# Patient Record
Sex: Female | Born: 1940 | Race: White | Hispanic: No | Smoking: Never smoker
Health system: Southern US, Community
[De-identification: ages and names within clinical notes are randomized; demographics above are authoritative.]

## PROBLEM LIST (undated history)

## (undated) DIAGNOSIS — R42 Dizziness and giddiness: Secondary | ICD-10-CM

## (undated) DIAGNOSIS — E7800 Pure hypercholesterolemia, unspecified: Secondary | ICD-10-CM

## (undated) DIAGNOSIS — I4891 Unspecified atrial fibrillation: Secondary | ICD-10-CM

## (undated) DIAGNOSIS — E079 Disorder of thyroid, unspecified: Secondary | ICD-10-CM

## (undated) DIAGNOSIS — I10 Essential (primary) hypertension: Secondary | ICD-10-CM

## (undated) DIAGNOSIS — J189 Pneumonia, unspecified organism: Secondary | ICD-10-CM

---

## 1998-06-24 ENCOUNTER — Ambulatory Visit (HOSPITAL_COMMUNITY): Admission: RE | Admit: 1998-06-24 | Discharge: 1998-06-24 | Payer: Self-pay | Admitting: Family Medicine

## 2000-06-28 ENCOUNTER — Ambulatory Visit (HOSPITAL_COMMUNITY): Admission: RE | Admit: 2000-06-28 | Discharge: 2000-06-28 | Payer: Self-pay | Admitting: Family Medicine

## 2000-06-28 ENCOUNTER — Encounter: Payer: Self-pay | Admitting: Family Medicine

## 2002-01-23 ENCOUNTER — Ambulatory Visit (HOSPITAL_COMMUNITY): Admission: RE | Admit: 2002-01-23 | Discharge: 2002-01-23 | Payer: Self-pay | Admitting: Family Medicine

## 2002-05-09 ENCOUNTER — Ambulatory Visit (HOSPITAL_COMMUNITY): Admission: RE | Admit: 2002-05-09 | Discharge: 2002-05-09 | Payer: Self-pay | Admitting: Family Medicine

## 2002-05-09 ENCOUNTER — Encounter: Payer: Self-pay | Admitting: Family Medicine

## 2003-01-31 ENCOUNTER — Ambulatory Visit (HOSPITAL_COMMUNITY): Admission: RE | Admit: 2003-01-31 | Discharge: 2003-01-31 | Payer: Self-pay | Admitting: Family Medicine

## 2003-02-15 ENCOUNTER — Ambulatory Visit (HOSPITAL_COMMUNITY): Admission: RE | Admit: 2003-02-15 | Discharge: 2003-02-15 | Payer: Self-pay | Admitting: Family Medicine

## 2003-02-15 ENCOUNTER — Encounter: Payer: Self-pay | Admitting: Family Medicine

## 2005-04-03 ENCOUNTER — Emergency Department (HOSPITAL_COMMUNITY): Admission: EM | Admit: 2005-04-03 | Discharge: 2005-04-03 | Payer: Self-pay | Admitting: Emergency Medicine

## 2005-06-17 ENCOUNTER — Emergency Department (HOSPITAL_COMMUNITY): Admission: EM | Admit: 2005-06-17 | Discharge: 2005-06-17 | Payer: Self-pay | Admitting: Emergency Medicine

## 2008-06-03 ENCOUNTER — Inpatient Hospital Stay (HOSPITAL_COMMUNITY): Admission: EM | Admit: 2008-06-03 | Discharge: 2008-06-04 | Payer: Self-pay | Admitting: Emergency Medicine

## 2008-06-04 ENCOUNTER — Ambulatory Visit: Payer: Self-pay | Admitting: Surgery

## 2008-06-04 ENCOUNTER — Encounter (INDEPENDENT_AMBULATORY_CARE_PROVIDER_SITE_OTHER): Payer: Self-pay | Admitting: Internal Medicine

## 2011-02-23 NOTE — Discharge Summary (Signed)
[name], Sonya [name]              ACCOUNT NO.:  [id number]   MEDICAL RECORD NO.:  [id number]          PATIENT TYPE:  INP   LOCATION:  1412                         FACILITY:  [facility]   PHYSICIAN:  Hind I Elsaid, MD      DATE OF BIRTH:  28-Mar-1941   DATE OF ADMISSION:  06/03/2008  DATE OF DISCHARGE:  06/04/2008                               DISCHARGE SUMMARY   DISCHARGE DIAGNOSES:  1. Vertigo.  2. Gastroesophageal reflux disease.  3. Hypertension.  4. Hypertriglyceridemia.  5. Bilateral carotid stenosis 40-60%.  6. Anemia.   DISCHARGE MEDICATIONS:  1. Meclizine 25 mg p.o. q.6 hours.  2. Ativan 0.5 mg p.o. b.i.d. p.r.n.  3. Tricor 145 mg p.o. daily.   The patient to continue her home medications, which include:  1. Ranitidine.  2. Hydrochlorothiazide.  3. Lisinopril.   CONSULTATIONS:  None.   PROCEDURES:  CT head, chronic vessel disease.  MRI of the brain, no  acute infarct or abnormal intracranial enhancing lesions.  Scattered  nonspecific white-type changes.  Paranasal sinuses and left mastoid  airspace.  Mild intracranial atherosclerotic-type changes.  Moderate  stenosis of proximal left internal carotid artery and mild degree of  stenosis proximal right internal carotid artery.  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   HISTORY OF PRESENT ILLNESS:  1. This is a 70 year old female with a history of vertigo, who      presented with dizziness and vertigo.  The ER physician gave      Ativan, Zofran, and Meclizine, but no significant improvement.  We      are asked to admit to evaluate further vertigo.  The patient      admitted to the hospital and started on supportive measures with      Meclizine and Ativan.  Symptoms completely resolved within 24      hours.  Had MRI of the brain to rule out acute stroke or brainstem      infarct.  This was as above, negative.  Was felt symptoms most      probably secondary to vertigo.  The patient will be discharged with  Meclizine and Ativan, and the patient was asked to follow up with      [facility].  Also, physical therapy consulted for teaching the      patient Epley maneuver.  2. Hypertriglyceridemia, the patient will be discharged with Tri-      Chlor.  3. Hypertension.  Blood pressure came under control.  4. Depression.  The patient denies any suicidal ideations and denies      any symptoms, and at this time, she did not want any antidepressant      medications.  We felt that the patient was medically stable for      discharge home, follow up with her primary care physician within 1      week.      Hind Bosie Helper, MD  Electronically Signed     HIE/MEDQ  D:  06/04/2008  T:  06/04/2008  Job:  [id number]

## 2011-02-23 NOTE — H&P (Signed)
NAME:  Sonya [name], Sonya [name]              ACCOUNT NO.:  [id number]   MEDICAL RECORD NO.:  [id number]          PATIENT TYPE:  EMS   LOCATION:  ED                           FACILITY:  [facility]   PHYSICIAN:  Hind I Elsaid, MD      DATE OF BIRTH:  Oct 15, 1940   DATE OF ADMISSION:  06/03/2008  DATE OF DISCHARGE:                              HISTORY & PHYSICAL   PRIMARY CARE PHYSICIAN:  Unassigned.   CHIEF COMPLAINT:  Dizziness, one day.   HISTORY OF PRESENT ILLNESS:  This is a 70 year old Caucasian female with  a history of vertigo.  The last attack was one week ago.  The patient  woke up today with a chief complaint of dizziness and spinning.  The  patient felt she is spinning around the room, associated with nausea and  vomiting.  The patient vomited four times, clear liquids to food  particles.  The condition associated with sweating, feeling hot.  The  patient admitted the symptoms reverted when she lies flat.  She has to  turn her head gradually so to avoid worsening.  The patient denies any  numbness or weakness.  Denies any neck pain.  Denies any ear discharge.  Denies any roaring on her ear.  Denies any blurring of vision.   In the emergency room, the patient received Zofran, meclizine 50 mg and  Ativan 1 mg IV with no significant improvement of her symptoms.   PAST MEDICAL HISTORY:  1. Vertigo.  2. GERD.  3. Hypertension.   MEDICATIONS:  1. Loratadine.  2. Lisinopril.  3. Hydrochlorothiazide, dose unknown.   ALLERGIES:  No known drug allergies.   SOCIAL HISTORY:  She drinks.  She lives by herself.  She has five  children and grandchildren.  She occasionally smokes cigarettes and  drinks alcohol.  She admitted she drinks 12 beers twice weekly.  She  denies any heavy drinking.  She denies IV drug use.  She is on social  disability.   FAMILY HISTORY:  Mother died at age 87.  She had a history of bypass  surgery.  Father history of a stroke.   REVIEW OF SYSTEMS:  Patient denies  chest pain.  Denies any shortness of  breath.  Denies any abdominal pain.  Denies any burning micturition.  Denies any headache.  Denies any seizure activity.  Denies any abnormal  skin rash.   PHYSICAL EXAMINATION:  Temperature 97.7, blood pressure 144/52, pulse  rate 128, sinus tach.  Respiratory rate 24.  Satting at 98% on room air.  HEENT:  Patient looks sick.  Not in respiratory distress or shortness of  breath.  Pupils are equal, round and reactive to light and  accommodation.  No nystagmus on examination.  Mucosa moist.  NECK:  Supple.  No JVD.  No lymphadenopathy.  HEART:  S1 and S2.  No added sounds.  LUNGS:  Normal fascicular breathing with equal air entry.  ABDOMEN:  Soft, nontender.  Bowel sounds positive.  EXTREMITIES:  No lower extremity edema.  Peripheral pulses are intact.  CNS:  Patient awake, alert, and oriented x3.  Could not appreciate any  focal neurological deficits.  Gait is not done secondary to patient's  symptoms.   BLOOD WORK:  Urinalysis:  White blood cells 3-6.  Cardiac markers  negative.  CBC:  Bilirubin 11.3, hematocrit 34.8, white blood cells 8.6,  platelets 267.  Sodium 139, potassium 4, chloride 108, glucose 127, BUN  18, creatinine 0.9.   CT head cannot be done, as the patient has severe symptoms.   ASSESSMENT/PLAN:  1. Vertigo:  Cannot rule out benign positional vertigo.  Cannot rule      out brain or cerebral stroke or Meniere's disease.  Patient was      admitted to the hospital to get supportive measures with meclizine      and Ativan.  Will get MRI of the brain and MRA of brain and neck.  2. Hypertension:  Will continue with lisinopril and      hydrochlorothiazide.  3. Family concerned that grandmother many times mentioned that she      does want to live anymore.  They feel she is depressed after her      husband died and recently she lost her son.  Will start the patient      on Lexapro 10 mg daily and will consider consulting Dr.  Jeanie Sewer.      At this time, patient denies any suicidal ideation.  4. Deep venous thrombosis and gastrointestinal prophylaxis.      Hind Bosie Helper, MD  Electronically Signed     HIE/MEDQ  D:  06/03/2008  T:  06/03/2008  Job:  [id number]

## 2014-07-11 ENCOUNTER — Encounter (HOSPITAL_COMMUNITY): Payer: Self-pay | Admitting: Emergency Medicine

## 2014-07-11 ENCOUNTER — Emergency Department (HOSPITAL_COMMUNITY)
Admission: EM | Admit: 2014-07-11 | Discharge: 2014-07-12 | Disposition: A | Discharge status: Home / Self Care | Payer: Medicare Other | Attending: Emergency Medicine | Admitting: Emergency Medicine

## 2014-07-11 DIAGNOSIS — R002 Palpitations: Secondary | ICD-10-CM | POA: Insufficient documentation

## 2014-07-11 DIAGNOSIS — Z87891 Personal history of nicotine dependence: Secondary | ICD-10-CM | POA: Insufficient documentation

## 2014-07-11 DIAGNOSIS — I10 Essential (primary) hypertension: Secondary | ICD-10-CM | POA: Insufficient documentation

## 2014-07-11 DIAGNOSIS — R0602 Shortness of breath: Secondary | ICD-10-CM | POA: Insufficient documentation

## 2014-07-11 DIAGNOSIS — K219 Gastro-esophageal reflux disease without esophagitis: Secondary | ICD-10-CM | POA: Insufficient documentation

## 2014-07-11 DIAGNOSIS — Z8701 Personal history of pneumonia (recurrent): Secondary | ICD-10-CM | POA: Insufficient documentation

## 2014-07-11 DIAGNOSIS — E079 Disorder of thyroid, unspecified: Secondary | ICD-10-CM | POA: Insufficient documentation

## 2014-07-11 DIAGNOSIS — Z7982 Long term (current) use of aspirin: Secondary | ICD-10-CM | POA: Insufficient documentation

## 2014-07-11 DIAGNOSIS — E782 Mixed hyperlipidemia: Secondary | ICD-10-CM | POA: Insufficient documentation

## 2014-07-11 DIAGNOSIS — Z79899 Other long term (current) drug therapy: Secondary | ICD-10-CM | POA: Insufficient documentation

## 2014-07-11 DIAGNOSIS — R0789 Other chest pain: Secondary | ICD-10-CM

## 2014-07-11 HISTORY — DX: Pneumonia, unspecified organism: J18.9

## 2014-07-11 HISTORY — DX: Dizziness and giddiness: R42

## 2014-07-11 HISTORY — DX: Pure hypercholesterolemia, unspecified: E78.00

## 2014-07-11 HISTORY — DX: Essential (primary) hypertension: I10

## 2014-07-11 HISTORY — DX: Disorder of thyroid, unspecified: E07.9

## 2014-07-11 NOTE — ED Provider Notes (Signed)
CSN: [id number]     Arrival date & time 07/11/14  2336 History   This chart was scribed for Sonya Kaplan, MD by Sonya [name], ED Scribe. This patient was seen in room APA08/APA08 and the patient's care was started at 11:43 PM.    Chief Complaint  Patient presents with  . Chest Pain    The history is provided by the patient. No language interpreter was used.    HPI Comments: Sonya [name] is a 73 y.o. female, with history of HTN, thyroid disease, vertigo, and pneumonia, who presents to the Emergency Department complaining of worsening palpitations onset 2 weeks ago. She reports it started out as a "flutter" of her heart, but has gotten worse. There is associated mild CP - described as a feeling one gets when they are lifting and SOB, but states this has improved over the last few days. Pt has no exertional dyspnea or chest pain, and her sx are intermittent, and sporadic. Patient states  she feels the chest pain across her chest, on both sides and sometime into her back. Patient states her symptoms worsen when lying flat - as the flutter starts feeling like something is "jumping" in her chest. She reports history of GERD is concerned if this is the cause of her palpitations. She denies history of MI, DM, COPD, PE/DVT, or CA. Patient is a former smoker and reports family history of heart problems (grandmother).       Past Medical History  Diagnosis Date  . Hypertension   . Thyroid disease   . Pneumonia   . Vertigo   . Hypercholesteremia    History reviewed. No pertinent past surgical history. History reviewed. No pertinent family history. History  Substance Use Topics  . Smoking status: Former Games developer  . Smokeless tobacco: Not on file  . Alcohol Use: No   OB History   Grav Para Term Preterm Abortions TAB SAB Ect Mult Living                 Review of Systems  Constitutional: Negative for activity change.  Respiratory: Positive for shortness of breath. Negative for cough.    Cardiovascular: Positive for chest pain (mild) and palpitations.  Gastrointestinal: Negative for nausea, vomiting and abdominal pain.  Genitourinary: Negative for dysuria.  Musculoskeletal: Negative for neck pain.  Neurological: Negative for headaches.      Allergies  Review of patient's allergies indicates no known allergies.  Home Medications   Prior to Admission medications   Medication Sig Start Date End Date Taking? Authorizing Provider  albuterol (PROVENTIL HFA;VENTOLIN HFA) 108 (90 BASE) MCG/ACT inhaler Inhale 2 puffs into the lungs every 6 (six) hours as needed for wheezing or shortness of breath.   Yes Historical Provider, MD  aspirin 81 MG tablet Take 81 mg by mouth daily.   Yes Historical Provider, MD  lansoprazole (PREVACID) 15 MG capsule Take 15 mg by mouth daily at 12 noon.   Yes Historical Provider, MD  levothyroxine (SYNTHROID, LEVOTHROID) 75 MCG tablet Take 75 mcg by mouth daily before breakfast.   Yes Historical Provider, MD  lisinopril-hydrochlorothiazide (PRINZIDE,ZESTORETIC) 10-12.5 MG per tablet Take 1 tablet by mouth daily.   Yes Historical Provider, MD  lovastatin (MEVACOR) 10 MG tablet Take 10 mg by mouth at bedtime.   Yes Historical Provider, MD  Omega-3 Fatty Acids (FISH OIL) 1200 MG CAPS Take by mouth.   Yes Historical Provider, MD  omeprazole (PRILOSEC) 20 MG capsule Take 20 mg by mouth daily.  Yes Historical Provider, MD   Triage Vitals: BP 155/80  Pulse 85  Temp(Src) 98.3 F (36.8 C)  Resp 18  Ht 5\' 4"  (1.626 m)  Wt 165 lb (74.844 kg)  BMI 28.31 kg/m2  SpO2 97%  Physical Exam  Nursing note and vitals reviewed. Constitutional: She is oriented to person, place, and time. She appears well-developed and well-nourished.  HENT:  Head: Normocephalic and atraumatic.  Eyes: EOM are normal. Pupils are equal, round, and reactive to light.  Neck: Neck supple. No JVD present.  Cardiovascular: Normal rate, regular rhythm and normal heart sounds.   No  murmur heard. Pulmonary/Chest: Effort normal and breath sounds normal. No respiratory distress. She has no wheezes. She has no rhonchi. She has no rales.  Abdominal: Soft. She exhibits no distension and no mass. There is no tenderness. There is no rebound and no guarding.  No palpable mass  Musculoskeletal: She exhibits no edema.  BLE: there is no evidence of pitting edema. No calf swelling.  Neurological: She is alert and oriented to person, place, and time.  Skin: Skin is warm and dry.    ED Course  Procedures (including critical care time)  DIAGNOSTIC STUDIES: Oxygen Saturation is 97% on room air, adequate by my interpretation.    COORDINATION OF CARE: At 0010 Discussed treatment plan with patient which includes CXR and labs. Patient agrees.   Labs Review Labs Reviewed  BASIC METABOLIC PANEL - Abnormal; Notable for the following:    Glucose, Bld 112 (*)    BUN 24 (*)    GFR calc non Af Amer 50 (*)    GFR calc Af Amer 58 (*)    All other components within normal limits  CBC WITH DIFFERENTIAL  TROPONIN I  TROPONIN I    Imaging Review Dg Chest 2 View  07/12/2014   CLINICAL DATA:  Chest fluttering  EXAM: CHEST  2 VIEW  COMPARISON:  07/09/2014  FINDINGS: Lungs are essentially clear. No focal consolidation. No pleural effusion or pneumothorax.  The heart is normal in size.  Mild degenerative changes of the visualized thoracolumbar spine.  IMPRESSION: No evidence of acute cardiopulmonary disease.   Electronically Signed   By: Sonya [name] M.D.   On: 07/12/2014 01:06     EKG Interpretation   Date/Time:  Thursday July 11 2014 23:56:13 EDT Ventricular Rate:  87 PR Interval:  177 QRS Duration: 82 QT Interval:  356 QTC Calculation: 428 R Axis:   54 Text Interpretation:  Sinus rhythm Abnormal R-wave progression, early  transition q waves in inferior and lateral leads No acute changes no  comparison available Confirmed by Rhunette Croft, MD, Janey Genta [phone]) on 07/12/2014   12:16:17 AM      MDM   Final diagnoses:  Palpitations  Gastroesophageal reflux disease without esophagitis  Chest discomfort    I personally performed the services described in this documentation, which was scribed in my presence. The recorded information has been reviewed and is accurate.  Pt comes in with some atypical symptoms. She has, what seems to be palpitations, that are intermittent, but frequent, that last for a few seconds. The same symptoms are worse when patient is laying supine. Patient also atypical chest discomfort. Her HEART score is 3, 2 for age, 1 for risk factors. Trops x 2 and EKG are WNL.  Pt had monitoring for 4+ hours in the ER, and she has had no dysrhythmia.  Unsure what is causing her sx, but presume some component of sx to be  esophageal spasms.  Advised pcp f/u for optimal care Return precautions discussed.   Sonya KaplanAnkit Kameisha Malicki, MD 07/12/14 [phone]

## 2014-07-11 NOTE — ED Notes (Signed)
Pt c/o upper back pain that radiates to the chest x 2 weeks.

## 2014-07-12 ENCOUNTER — Emergency Department (HOSPITAL_COMMUNITY): Payer: Medicare Other

## 2014-07-12 LAB — CBC WITH DIFFERENTIAL/PLATELET
BASOS ABS: 0 10*3/uL (ref 0.0–0.1)
Basophils Relative: 1 % (ref 0–1)
Eosinophils Absolute: 0.2 10*3/uL (ref 0.0–0.7)
Eosinophils Relative: 2 % (ref 0–5)
HEMATOCRIT: 37.2 % (ref 36.0–46.0)
HEMOGLOBIN: 12.7 g/dL (ref 12.0–15.0)
LYMPHS ABS: 1.9 10*3/uL (ref 0.7–4.0)
LYMPHS PCT: 24 % (ref 12–46)
MCH: 29.7 pg (ref 26.0–34.0)
MCHC: 34.1 g/dL (ref 30.0–36.0)
MCV: 87.1 fL (ref 78.0–100.0)
MONO ABS: 0.5 10*3/uL (ref 0.1–1.0)
Monocytes Relative: 6 % (ref 3–12)
NEUTROS ABS: 5.2 10*3/uL (ref 1.7–7.7)
Neutrophils Relative %: 67 % (ref 43–77)
Platelets: 238 10*3/uL (ref 150–400)
RBC: 4.27 MIL/uL (ref 3.87–5.11)
RDW: 12.7 % (ref 11.5–15.5)
WBC: 7.8 10*3/uL (ref 4.0–10.5)

## 2014-07-12 LAB — BASIC METABOLIC PANEL
ANION GAP: 11 (ref 5–15)
BUN: 24 mg/dL — ABNORMAL HIGH (ref 6–23)
CHLORIDE: 104 meq/L (ref 96–112)
CO2: 27 meq/L (ref 19–32)
CREATININE: 1.08 mg/dL (ref 0.50–1.10)
Calcium: 9.7 mg/dL (ref 8.4–10.5)
GFR calc Af Amer: 58 mL/min — ABNORMAL LOW (ref >90)
GFR calc non Af Amer: 50 mL/min — ABNORMAL LOW (ref >90)
GLUCOSE: 112 mg/dL — AB (ref 70–99)
Potassium: 4.2 mEq/L (ref 3.7–5.3)
Sodium: 142 mEq/L (ref 137–147)

## 2014-07-12 LAB — TROPONIN I: Troponin I: <0.3 ng/mL (ref <0.30)

## 2014-07-12 NOTE — Discharge Instructions (Signed)
We saw you in the ER for the chest discomfort, palpitation like feeling. All of our cardiac workup is normal, including labs, EKG and chest X-RAY are normal. We are not sure what is causing your discomfort, but we feel comfortable sending you home at this time.  The workup in the ER is not complete, and you should follow up with your primary care doctor for further evaluation.  Given that you feel like you have "flutter" like feeling, and when laying down "jumping" like feeling when laying flat - we think you might possibly benefit from Holter monitoring, and evaluation of GERD. IT is the primary care doctor who will decide on what is optimal care for you.  Palpitations A palpitation is the feeling that your heartbeat is irregular or is faster than normal. It may feel like your heart is fluttering or skipping a beat. Palpitations are usually not a serious problem. However, in some cases, you may need further medical evaluation. CAUSES  Palpitations can be caused by:  Smoking.  Caffeine or other stimulants, such as diet pills or energy drinks.  Alcohol.  Stress and anxiety.  Strenuous physical activity.  Fatigue.  Certain medicines.  Heart disease, especially if you have a history of irregular heart rhythms (arrhythmias), such as atrial fibrillation, atrial flutter, or supraventricular tachycardia.  An improperly working pacemaker or defibrillator. DIAGNOSIS  To find the cause of your palpitations, your health care provider will take your medical history and perform a physical exam. Your health care provider may also have you take a test called an ambulatory electrocardiogram (ECG). An ECG records your heartbeat patterns over a 24-hour period. You may also have other tests, such as:  Transthoracic echocardiogram (TTE). During echocardiography, sound waves are used to evaluate how blood flows through your heart.  Transesophageal echocardiogram (TEE).  Cardiac monitoring. This allows  your health care provider to monitor your heart rate and rhythm in real time.  Holter monitor. This is a portable device that records your heartbeat and can help diagnose heart arrhythmias. It allows your health care provider to track your heart activity for several days, if needed.  Stress tests by exercise or by giving medicine that makes the heart beat faster. TREATMENT  Treatment of palpitations depends on the cause of your symptoms and can vary greatly. Most cases of palpitations do not require any treatment other than time, relaxation, and monitoring your symptoms. Other causes, such as atrial fibrillation, atrial flutter, or supraventricular tachycardia, usually require further treatment. HOME CARE INSTRUCTIONS   Avoid:  Caffeinated coffee, tea, soft drinks, diet pills, and energy drinks.  Chocolate.  Alcohol.  Stop smoking if you smoke.  Reduce your stress and anxiety. Things that can help you relax include:  A method of controlling things in your body, such as your heartbeats, with your mind (biofeedback).  Yoga.  Meditation.  Physical activity such as swimming, jogging, or walking.  Get plenty of rest and sleep. SEEK MEDICAL CARE IF:   You continue to have a fast or irregular heartbeat beyond 24 hours.  Your palpitations occur more often. SEEK IMMEDIATE MEDICAL CARE IF:  You have chest pain or shortness of breath.  You have a severe headache.  You feel dizzy or you faint. MAKE SURE YOU:  Understand these instructions.  Will watch your condition.  Will get help right away if you are not doing well or get worse. Document Released: 09/24/2000 Document Revised: 10/02/2013 Document Reviewed: 11/26/2011 [facility] Patient Information 2015 [employer], [place]. This information  is not intended to replace advice given to you by your health care provider. Make sure you discuss any questions you have with your health care provider.

## 2014-07-12 NOTE — ED Notes (Signed)
Pt alert & oriented x4, stable gait. Patient given discharge instructions, paperwork & prescription(s). Patient  instructed to stop at the registration desk to finish any additional paperwork. Patient verbalized understanding. Pt left department w/ no further questions. 

## 2017-11-06 ENCOUNTER — Emergency Department (HOSPITAL_COMMUNITY): Payer: Medicare Other

## 2017-11-06 ENCOUNTER — Emergency Department (HOSPITAL_COMMUNITY)
Admission: EM | Admit: 2017-11-06 | Discharge: 2017-11-06 | Disposition: A | Discharge status: Home / Self Care | Payer: Medicare Other | Attending: Emergency Medicine | Admitting: Emergency Medicine

## 2017-11-06 ENCOUNTER — Encounter (HOSPITAL_COMMUNITY): Payer: Self-pay

## 2017-11-06 ENCOUNTER — Other Ambulatory Visit: Payer: Self-pay

## 2017-11-06 DIAGNOSIS — I4892 Unspecified atrial flutter: Secondary | ICD-10-CM | POA: Insufficient documentation

## 2017-11-06 DIAGNOSIS — Z87891 Personal history of nicotine dependence: Secondary | ICD-10-CM | POA: Insufficient documentation

## 2017-11-06 DIAGNOSIS — I10 Essential (primary) hypertension: Secondary | ICD-10-CM | POA: Insufficient documentation

## 2017-11-06 LAB — BASIC METABOLIC PANEL
Anion gap: 12 (ref 5–15)
BUN: 21 mg/dL — AB (ref 6–20)
CO2: 25 mmol/L (ref 22–32)
Calcium: 9.5 mg/dL (ref 8.9–10.3)
Chloride: 106 mmol/L (ref 101–111)
Creatinine, Ser: 1.17 mg/dL — ABNORMAL HIGH (ref 0.44–1.00)
GFR calc Af Amer: 52 mL/min — ABNORMAL LOW (ref >60)
GFR, EST NON AFRICAN AMERICAN: 45 mL/min — AB (ref >60)
Glucose, Bld: 95 mg/dL (ref 65–99)
Potassium: 3.6 mmol/L (ref 3.5–5.1)
SODIUM: 143 mmol/L (ref 135–145)

## 2017-11-06 LAB — CBC
HCT: 39.3 % (ref 36.0–46.0)
Hemoglobin: 12.6 g/dL (ref 12.0–15.0)
MCH: 29 pg (ref 26.0–34.0)
MCHC: 32.1 g/dL (ref 30.0–36.0)
MCV: 90.3 fL (ref 78.0–100.0)
Platelets: 206 10*3/uL (ref 150–400)
RBC: 4.35 MIL/uL (ref 3.87–5.11)
RDW: 13.2 % (ref 11.5–15.5)
WBC: 5.4 10*3/uL (ref 4.0–10.5)

## 2017-11-06 LAB — TROPONIN I: Troponin I: <0.03 ng/mL (ref <0.03)

## 2017-11-06 LAB — TSH: TSH: 1.195 u[IU]/mL (ref 0.350–4.500)

## 2017-11-06 MED ORDER — METOPROLOL SUCCINATE ER 25 MG PO TB24: 25.0000 mg | ORAL_TABLET | Freq: Every day | ORAL | 0 refills | Status: AC

## 2017-11-06 MED ORDER — METOPROLOL TARTRATE 25 MG PO TABS
12.5000 mg | ORAL_TABLET | Freq: Once | ORAL | Status: AC
  Administered 2017-11-06: 12.5 mg via ORAL
  Filled 2017-11-06: qty 1

## 2017-11-06 MED ORDER — LORAZEPAM 0.5 MG PO TABS
0.5000 mg | ORAL_TABLET | Freq: Once | ORAL | Status: AC
  Administered 2017-11-06: 0.5 mg via ORAL
  Filled 2017-11-06: qty 1

## 2017-11-06 MED ORDER — APIXABAN 5 MG PO TABS: 5.0000 mg | ORAL_TABLET | Freq: Two times a day (BID) | ORAL | 0 refills | Status: AC

## 2017-11-06 MED ORDER — IOPAMIDOL (ISOVUE-300) INJECTION 61%
75.0000 mL | Freq: Once | INTRAVENOUS | Status: AC | PRN
  Administered 2017-11-06: 75 mL via INTRAVENOUS

## 2017-11-06 MED ORDER — APIXABAN 5 MG PO TABS
5.0000 mg | ORAL_TABLET | Freq: Two times a day (BID) | ORAL | Status: DC
  Administered 2017-11-06: 5 mg via ORAL
  Filled 2017-11-06 (×3): qty 1

## 2017-11-06 NOTE — ED Triage Notes (Signed)
Patient reports of mid lower abdominal pain that radiates into chest, down left arm and neck x 2 years. States it feels like a "fish flopping" Patient states she was diagnosed with GERD but not getting better.

## 2017-11-06 NOTE — ED Notes (Signed)
Patient transported to X-ray 

## 2017-11-06 NOTE — ED Provider Notes (Signed)
Emergency Department Provider Note   I have reviewed the triage vital signs and the nursing notes.   HISTORY  Chief Complaint Palpitations   HPI Sonya [name] is a 77 y.o. female with h/o HTN, HLD, hypothyroidism presents to the emergency department with a sensation of beating in her left chest this been going on for approximately 2 years that she is noticed.  She states that she does not notice it when she is up moving around and being active however when she is at rest she notices a rhythmic beating in her left chest.  She does not feel any shortness of breath, pain, nausea, vomiting or diaphoresis with it.  She never had any syncope or lightheadedness with it.  States she has been checked out multiple times and no one can find out what is wrong with her.  States she did have rheumatic heart disease as a kid.  Has been diagnosed with reflux as the cause of her symptoms and started on medication but that is not seem to help.  No other associated or modifying symptoms.    Past Medical History:  Diagnosis Date  . Hypercholesteremia   . Hypertension   . Pneumonia   . Thyroid disease   . Vertigo     There are no active problems to display for this patient.   History reviewed. No pertinent surgical history.  Current Outpatient Rx  . Order #: [id number] Class: Historical Med  . Order #: [id number] Class: Historical Med  . Order #: [id number] Class: Historical Med  . Order #: [id number] Class: Historical Med  . Order #: [id number] Class: Historical Med  . Order #: [id number] Class: Historical Med  . Order #: [id number] Class: Historical Med  . Order #: [id number] Class: Historical Med  . Order #: [id number] Class: Print  . Order #: [id number] Class: Print    Allergies Patient has no known allergies.  No family history on file.  Social History Social History   Tobacco Use  . Smoking status: Former Games developermoker  . Smokeless tobacco: Never Used  Substance Use Topics  . Alcohol use: No  . Drug  use: No    Review of Systems  All other systems negative except as documented in the HPI. All pertinent positives and negatives as reviewed in the HPI. ____________________________________________   PHYSICAL EXAM:  VITAL SIGNS: ED Triage Vitals [11/06/17 1646]  Enc Vitals Group     BP 127/83     Pulse Rate 91     Resp 14     Temp 98.6 F (37 C)     Temp Source Oral     SpO2 100 %     Weight 147 lb (66.7 kg)     Height 5\' 5"  (1.651 m)     Head Circumference      Peak Flow      Pain Score 10     Pain Loc      Pain Edu?      Excl. in GC?     Constitutional: Alert and oriented. Well appearing and in no acute distress. Eyes: Conjunctivae are normal. PERRL. EOMI. Head: Atraumatic. Nose: No congestion/rhinnorhea. Mouth/Throat: Mucous membranes are moist.  Oropharynx non-erythematous. Neck: No stridor.  No meningeal signs.   Cardiovascular: Normal rate, regular rhythm. Good peripheral circulation. Grossly normal heart sounds.   Respiratory: Normal respiratory effort.  No retractions. Lungs CTAB. Gastrointestinal: Soft and nontender. No distention.  usculoskeletal: No lower extremity tenderness nor edema. No gross deformities of extremities. Neurologic:  Normal speech and language. No gross  focal neurologic deficits are appreciated.  Skin:  Skin is warm, dry and intact. No rash noted.   ____________________________________________   LABS (all labs ordered are listed, but only abnormal results are displayed)  Labs Reviewed  BASIC METABOLIC PANEL - Abnormal; Notable for the following components:      Result Value   BUN 21 (*)    Creatinine, Ser 1.17 (*)    GFR calc non Af Amer 45 (*)    GFR calc Af Amer 52 (*)    All other components within normal limits  CBC  TSH  TROPONIN I   ____________________________________________  EKG   EKG Interpretation  Date/Time:  Sunday November 06 2017 16:52:38 EST Ventricular Rate:  88 PR Interval:    QRS Duration: 96 QT  Interval:  348 QTC Calculation: 421 R Axis:   75 Text Interpretation:  Atrial flutter with variable A-V block Abnormal ECG Confirmed by Marily Memos [phone]) on 11/06/2017 5:45:29 PM       ____________________________________________  RADIOLOGY  Dg Chest 2 View  Result Date: 11/06/2017 CLINICAL DATA:  Patient with palpitations. EXAM: CHEST  2 VIEW COMPARISON:  Chest radiograph 07/12/2014 FINDINGS: Monitoring leads overlie the patient. Normal cardiac and mediastinal contours. Aortic atherosclerosis. Pulmonary hyperinflation. No large area of pulmonary consolidation. No pleural effusion or pneumothorax. Thoracic spine degenerative changes. Focal 8 mm nodular opacity right upper hemithorax. IMPRESSION: Focal nodular opacity right upper hemithorax. Recommend further evaluation with chest CT. No acute cardiopulmonary process. Electronically Signed   By: Annia Belt M.D.   On: 11/06/2017 18:24   Ct Chest W Contrast  Result Date: 11/06/2017 CLINICAL DATA:  Patient with possible nodular opacity demonstrated on chest radiograph. EXAM: CT CHEST WITH CONTRAST TECHNIQUE: Multidetector CT imaging of the chest was performed during intravenous contrast administration. CONTRAST:  75mL ISOVUE-300 IOPAMIDOL (ISOVUE-300) INJECTION 61% COMPARISON:  Chest radiograph earlier same day. FINDINGS: Cardiovascular: Normal heart size. Trace pericardial fluid. Coronary arterial vascular calcifications. Thoracic aortic vascular calcifications. Mediastinum/Nodes: No enlarged axillary, mediastinal or hilar lymphadenopathy. Normal esophagus. Lungs/Pleura: Central airways are patent. Dependent atelectasis within the left lower lobe. No pleural effusion. No definite discrete nodule identified within the right or left lung. Upper Abdomen: No acute process. Musculoskeletal: No aggressive or acute appearing osseous lesions. Thoracic spine degenerative changes. IMPRESSION: No acute process within the chest. No definite nodule identified.  Aortic Atherosclerosis (ICD10-I70.0). Electronically Signed   By: Annia Belt M.D.   On: 11/06/2017 19:41    ____________________________________________   PROCEDURES  Procedure(s) performed:   Procedures   ____________________________________________   INITIAL IMPRESSION / ASSESSMENT AND PLAN / ED COURSE  Low suspicion for any cardiac disease as the cause of her symptoms.  However will get EKG, TSH and electrolytes and treat her for possible anxiety as I feel like she is just noticing her heartbeat.  We will also keep her on a monitor to make sure she did not have any arrhythmias however she was having the sensation while is in the room she was in a sinus rhythm in the 70-80 range.  ECG does show possible Aflutter with variable block which could account for her symptoms?   Chadsvasc: 3 (female, age, HTN) so will need some type of anticoagulant. Will start a low dose beta blocker as well and cardiology follow up after ensuring symptoms improving.      Pertinent labs & imaging results that were available during my care of the patient were reviewed by me and considered in my medical decision making (  see chart for details).  ____________________________________________  FINAL CLINICAL IMPRESSION(S) / ED DIAGNOSES  Final diagnoses:  Atrial flutter by electrocardiogram [facility])     MEDICATIONS GIVEN DURING THIS VISIT:  Medications  apixaban (ELIQUIS) tablet 5 mg (5 mg Oral Given 11/06/17 2046)  LORazepam (ATIVAN) tablet 0.5 mg (0.5 mg Oral Given 11/06/17 1807)  metoprolol tartrate (LOPRESSOR) tablet 12.5 mg (12.5 mg Oral Given 11/06/17 1807)  iopamidol (ISOVUE-300) 61 % injection 75 mL (75 mLs Intravenous Contrast Given 11/06/17 1854)     NEW OUTPATIENT MEDICATIONS STARTED DURING THIS VISIT:  Discharge Medication List as of 11/06/2017  8:04 PM    START taking these medications   Details  apixaban (ELIQUIS) 5 MG TABS tablet Take 1 tablet (5 mg total) by mouth 2 (two) times  daily., Starting Sun 11/06/2017, Print    metoprolol succinate (TOPROL-XL) 25 MG 24 hr tablet Take 1 tablet (25 mg total) by mouth daily., Starting Sun 11/06/2017, Print        Note:  This note was prepared with assistance of Dragon voice recognition software. Occasional wrong-word or sound-a-like substitutions may have occurred due to the inherent limitations of voice recognition software.   Marily Memos, MD 11/06/17 [phone]

## 2019-11-19 ENCOUNTER — Emergency Department (HOSPITAL_COMMUNITY)
Admission: EM | Admit: 2019-11-19 | Discharge: 2019-11-20 | Disposition: A | Discharge status: Home / Self Care | Payer: Self-pay | Attending: Emergency Medicine | Admitting: Emergency Medicine

## 2019-11-19 ENCOUNTER — Other Ambulatory Visit: Payer: Self-pay

## 2019-11-19 ENCOUNTER — Encounter (HOSPITAL_COMMUNITY): Payer: Self-pay | Admitting: Emergency Medicine

## 2019-11-19 DIAGNOSIS — L0291 Cutaneous abscess, unspecified: Secondary | ICD-10-CM

## 2019-11-19 DIAGNOSIS — L723 Sebaceous cyst: Secondary | ICD-10-CM | POA: Insufficient documentation

## 2019-11-19 DIAGNOSIS — Z87891 Personal history of nicotine dependence: Secondary | ICD-10-CM | POA: Insufficient documentation

## 2019-11-19 DIAGNOSIS — Z7901 Long term (current) use of anticoagulants: Secondary | ICD-10-CM | POA: Insufficient documentation

## 2019-11-19 DIAGNOSIS — I10 Essential (primary) hypertension: Secondary | ICD-10-CM | POA: Insufficient documentation

## 2019-11-19 DIAGNOSIS — E079 Disorder of thyroid, unspecified: Secondary | ICD-10-CM | POA: Insufficient documentation

## 2019-11-19 DIAGNOSIS — Z79899 Other long term (current) drug therapy: Secondary | ICD-10-CM | POA: Insufficient documentation

## 2019-11-19 NOTE — ED Notes (Signed)
Patient called for triage x 1 with no response.  

## 2019-11-19 NOTE — ED Triage Notes (Signed)
Patient reports worsening skin abscess at right lower abdomen with redness/swelling onset last week , denies drainage or fever .

## 2019-11-20 MED ORDER — LIDOCAINE-EPINEPHRINE (PF) 2 %-1:200000 IJ SOLN
10.0000 mL | Freq: Once | INTRAMUSCULAR | Status: AC
  Administered 2019-11-20: 10 mL via INTRADERMAL
  Filled 2019-11-20: qty 20

## 2019-11-20 NOTE — ED Provider Notes (Signed)
[facility] EMERGENCY DEPARTMENT Provider Note   CSN: [id number] Arrival date & time: 11/19/19  2241     History Chief Complaint  Patient presents with  . Abscess    Sonya [name] is a 79 y.o. female.  79 yo F with a swollen and red area to the right lower aspect of her abdomen.  Patient has had a spot here before was told it was a sebaceous cyst.  No fevers.  No trauma.  No breaks to the skin prior to this.  She would like it drained but she is a bit apprehensive about it.  The history is provided by the patient.  Abscess Associated symptoms: no fever, no headaches, no nausea and no vomiting   Illness Severity:  Moderate Onset quality:  Gradual Duration:  2 days Timing:  Constant Progression:  Worsening Chronicity:  New Associated symptoms: no chest pain, no congestion, no fever, no headaches, no myalgias, no nausea, no rhinorrhea, no shortness of breath, no vomiting and no wheezing        Past Medical History:  Diagnosis Date  . Hypercholesteremia   . Hypertension   . Pneumonia   . Thyroid disease   . Vertigo     There are no problems to display for this patient.   History reviewed. No pertinent surgical history.   OB History   No obstetric history on file.     No family history on file.  Social History   Tobacco Use  . Smoking status: Former Research scientist (life sciences)  . Smokeless tobacco: Never Used  Substance Use Topics  . Alcohol use: No  . Drug use: No    Home Medications Prior to Admission medications   Medication Sig Start Date End Date Taking? Authorizing Provider  apixaban (ELIQUIS) 5 MG TABS tablet Take 1 tablet (5 mg total) by mouth 2 (two) times daily. 11/06/17   Mesner, Corene Cornea, MD  cyanocobalamin 500 MCG tablet Take 500 mcg by mouth daily.    [provider]  levothyroxine (SYNTHROID, LEVOTHROID) 75 MCG tablet Take 75 mcg by mouth daily before breakfast.    [provider]  lisinopril-hydrochlorothiazide  (PRINZIDE,ZESTORETIC) 10-12.5 MG per tablet Take 1 tablet by mouth daily.    [provider]  lovastatin (MEVACOR) 20 MG tablet Take 40 mg by mouth at bedtime.     [provider]  metoprolol succinate (TOPROL-XL) 25 MG 24 hr tablet Take 1 tablet (25 mg total) by mouth daily. 11/06/17   Mesner, Corene Cornea, MD  Omega-3 Fatty Acids (FISH OIL) 1200 MG CAPS Take 1 capsule by mouth daily.     [provider]  omeprazole (PRILOSEC) 20 MG capsule Take 20 mg by mouth daily.    [provider]  sodium chloride (OCEAN) 0.65 % SOLN nasal spray Place 1 spray into both nostrils as needed for congestion.    [provider]  vitamin C (ASCORBIC ACID) 250 MG tablet Take 250 mg by mouth daily.    [provider]    Allergies    Patient has no known allergies.  Review of Systems   Review of Systems  Constitutional: Negative for chills and fever.  HENT: Negative for congestion and rhinorrhea.   Eyes: Negative for redness and visual disturbance.  Respiratory: Negative for shortness of breath and wheezing.   Cardiovascular: Negative for chest pain and palpitations.  Gastrointestinal: Negative for nausea and vomiting.  Genitourinary: Negative for dysuria and urgency.  Musculoskeletal: Negative for arthralgias and myalgias.  Skin:  Positive for wound. Negative for pallor.  Neurological: Negative for dizziness and headaches.    Physical Exam Updated Vital Signs BP (!) 120/55   Pulse (!) 50   Temp 97.9 F (36.6 C) (Oral)   Resp 17   SpO2 100%   Physical Exam Vitals and nursing note reviewed.  Constitutional:      General: She is not in acute distress.    Appearance: She is well-developed. She is not diaphoretic.  HENT:     Head: Normocephalic and atraumatic.  Eyes:     Pupils: Pupils are equal, round, and reactive to light.  Cardiovascular:     Rate and Rhythm: Normal rate and regular rhythm.     Heart sounds: No murmur. No friction rub. No gallop.    Pulmonary:     Effort: Pulmonary effort is normal.     Breath sounds: No wheezing or rales.  Abdominal:     General: There is no distension.     Palpations: Abdomen is soft.     Tenderness: There is no abdominal tenderness.     Comments: Area of erythema and fluctuance to the right lower quadrant.  Approximately silver dollar sized.  Musculoskeletal:        General: No tenderness.     Cervical back: Normal range of motion and neck supple.  Skin:    General: Skin is warm and dry.  Neurological:     Mental Status: She is alert and oriented to person, place, and time.  Psychiatric:        Behavior: Behavior normal.     ED Results / Procedures / Treatments   Labs (all labs ordered are listed, but only abnormal results are displayed) Labs Reviewed - No data to display  EKG None  Radiology No results found.  Procedures .Marland KitchenIncision and Drainage  Date/Time: 11/20/2019 6:19 AM Performed by: Melene Plan, DO Authorized by: Melene Plan, DO   Consent:    Consent obtained:  Verbal   Consent given by:  Patient   Risks discussed:  Bleeding, incomplete drainage, damage to other organs and infection   Alternatives discussed:  No treatment, delayed treatment and alternative treatment Location:    Type:  Abscess   Size:  Silver dollar   Location:  Trunk   Trunk location:  Abdomen Pre-procedure details:    Skin preparation:  Chloraprep Anesthesia (see MAR for exact dosages):    Anesthesia method:  Local infiltration   Local anesthetic:  Lidocaine 2% WITH epi Procedure type:    Complexity:  Complex Procedure details:    Needle aspiration: no     Incision types:  Single straight   Incision depth:  Subcutaneous   Scalpel blade:  11   Wound management:  Probed and deloculated   Drainage:  Bloody, purulent and serosanguinous   Drainage amount:  Copious   Wound treatment:  Wound left open   Packing materials:  None Post-procedure details:    Patient tolerance of procedure:   Tolerated well, no immediate complications   (including critical care time)  Medications Ordered in ED Medications  lidocaine-EPINEPHrine (XYLOCAINE W/EPI) 2 %-1:200000 (PF) injection 10 mL (10 mLs Intradermal Given 11/20/19 0537)    ED Course  I have reviewed the triage vital signs and the nursing notes.  Pertinent labs & imaging results that were available during my care of the patient were reviewed by me and considered in my medical decision making (see chart for details).    MDM Rules/Calculators/A&P  79 yo F with a chief complaints of an infected cyst.  Patient has had 1 before in a similar location and about 2 years ago.  Lanced at bedside.  PCP follow-up.  6:20 AM:  I have discussed the diagnosis/risks/treatment options with the patient and believe the pt to be eligible for discharge home to follow-up with PCP. We also discussed returning to the ED immediately if new or worsening sx occur. We discussed the sx which are most concerning (e.g., sudden worsening pain, fever, inability to tolerate by mouth) that necessitate immediate return. Medications administered to the patient during their visit and any new prescriptions provided to the patient are listed below.  Medications given during this visit Medications  lidocaine-EPINEPHrine (XYLOCAINE W/EPI) 2 %-1:200000 (PF) injection 10 mL (10 mLs Intradermal Given 11/20/19 0537)     The patient appears reasonably screen and/or stabilized for discharge and I doubt any other medical condition or other [facility] requiring further screening, evaluation, or treatment in the ED at this time prior to discharge.   Final Clinical Impression(s) / ED Diagnoses Final diagnoses:  Abscess  Sebaceous cyst    Rx / DC Orders ED Discharge Orders    None       Melene Plan, DO 11/20/19 9935

## 2019-11-20 NOTE — Discharge Instructions (Signed)
Warm compresses at least 4x a day.  Return for rapid spreading redness or fever.  

## 2019-11-20 NOTE — ED Notes (Signed)
Patient verbalizes understanding of discharge instructions. Opportunity for questioning and answers were provided. Armband removed by staff, pt discharged from ED. Pt. ambulatory and discharged home.  

## 2020-07-16 ENCOUNTER — Encounter (HOSPITAL_COMMUNITY): Payer: Self-pay

## 2020-07-16 ENCOUNTER — Other Ambulatory Visit: Payer: Self-pay

## 2020-07-16 ENCOUNTER — Emergency Department (HOSPITAL_COMMUNITY): Payer: Self-pay

## 2020-07-16 ENCOUNTER — Emergency Department (HOSPITAL_COMMUNITY)
Admission: EM | Admit: 2020-07-16 | Discharge: 2020-07-16 | Disposition: A | Discharge status: Home / Self Care | Payer: Self-pay | Attending: Emergency Medicine | Admitting: Emergency Medicine

## 2020-07-16 DIAGNOSIS — Z79899 Other long term (current) drug therapy: Secondary | ICD-10-CM | POA: Insufficient documentation

## 2020-07-16 DIAGNOSIS — I10 Essential (primary) hypertension: Secondary | ICD-10-CM | POA: Insufficient documentation

## 2020-07-16 DIAGNOSIS — R42 Dizziness and giddiness: Secondary | ICD-10-CM | POA: Insufficient documentation

## 2020-07-16 DIAGNOSIS — Z7901 Long term (current) use of anticoagulants: Secondary | ICD-10-CM | POA: Insufficient documentation

## 2020-07-16 HISTORY — DX: Unspecified atrial fibrillation: I48.91

## 2020-07-16 LAB — BASIC METABOLIC PANEL
Anion gap: 10 (ref 5–15)
BUN: 19 mg/dL (ref 8–23)
CO2: 26 mmol/L (ref 22–32)
Calcium: 9.5 mg/dL (ref 8.9–10.3)
Chloride: 103 mmol/L (ref 98–111)
Creatinine, Ser: 1.1 mg/dL — ABNORMAL HIGH (ref 0.44–1.00)
GFR calc non Af Amer: 48 mL/min — ABNORMAL LOW (ref >60)
Glucose, Bld: 103 mg/dL — ABNORMAL HIGH (ref 70–99)
Potassium: 3.8 mmol/L (ref 3.5–5.1)
Sodium: 139 mmol/L (ref 135–145)

## 2020-07-16 LAB — CBC
HCT: 37.3 % (ref 36.0–46.0)
Hemoglobin: 12.1 g/dL (ref 12.0–15.0)
MCH: 29.3 pg (ref 26.0–34.0)
MCHC: 32.4 g/dL (ref 30.0–36.0)
MCV: 90.3 fL (ref 80.0–100.0)
Platelets: 220 10*3/uL (ref 150–400)
RBC: 4.13 MIL/uL (ref 3.87–5.11)
RDW: 13.1 % (ref 11.5–15.5)
WBC: 7 10*3/uL (ref 4.0–10.5)
nRBC: 0 % (ref 0.0–0.2)

## 2020-07-16 LAB — TROPONIN I (HIGH SENSITIVITY)
Troponin I (High Sensitivity): 6 ng/L (ref <18)
Troponin I (High Sensitivity): 6 ng/L (ref <18)

## 2020-07-16 LAB — MAGNESIUM: Magnesium: 2 mg/dL (ref 1.7–2.4)

## 2020-07-16 NOTE — ED Triage Notes (Signed)
Pt states that last weeks she had thought "lord why don't you just take me if you are going to make me suffer like this, and I shouldn't have said that, but I was having a moment"  States that she doesn't have thoughts like this often, states that the thing with her heart all day and didn't have anyone to talk with.  States that she does have family that she can talk with a granddaughter.  States that she would never hurt herself, states that she was just frustrated with her health.  States that she wouldn't know how to kill herself anyway.

## 2020-07-16 NOTE — ED Provider Notes (Signed)
Medical screening examination/treatment/procedure(s) were conducted as a shared visit with non-physician practitioner(s) and myself.  I personally evaluated the patient during the encounter.  EKG Interpretation  Date/Time:  Wednesday July 16 2020 11:16:05 EDT Ventricular Rate:  116 PR Interval:    QRS Duration: 102 QT Interval:  401 QTC Calculation: 558 R Axis:   60 Text Interpretation: Atrial flutter/fibrillation Abnormal R-wave progression, early transition Borderline ST depression, diffuse leads Prolonged QT interval ST depression V1-V3, suggest recording posterior leads Since last tracing Afib has returned Confirmed by Susy Frizzle [phone]) on 07/16/2020 12:05:20 PM   Patient seen by me along with physician assistant.  Patient referred in by primary care doctor for concerns for atrial fibrillation.  Patient is on the blood thinner Eliquis.  Here atrial fibrillation is present but is rate controlled.  Occasionally up to around 100.  Most the time below that.  Patient's work-up without any acute findings.  The other concern is for vertigo.  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  But for the last month is been occurring just about every day.  Based on that duration no concern for an acute stroke.  But will refer patient to both cardiology and neurology for follow-up.   Vanetta Mulders, MD 07/16/20 1750

## 2020-07-16 NOTE — ED Notes (Signed)
Pt transported to CT ?

## 2020-07-16 NOTE — ED Provider Notes (Signed)
[facility] EMERGENCY DEPARTMENT Provider Note   CSN: [id number] Arrival date & time: 07/16/20  1058     History Chief Complaint  Patient presents with   Dizziness    Sonya [name] is a 79 y.o. female with past medical history of A. fib on metoprolol and Eliquis, hypercholesteremia, hypertension, thyroid disease, vertigo that presents the emergency department today via EMS for dizziness and A. fib.  Patient states that she was at her PCPs office today, told to go to the emergency department because she was having dizziness and her EKG monitor showed A. fib.  Patient states that she has a history of A. fib, has had it for several years however PCP thought that dizziness can be associated to A. fib.  When patient arrived EKG showed A. fib with RVR, heart rate 116, EKGs a couple minutes later showed normal sinus rhythm and patient states that she felt better.  When I was observing patient, patient was in normal sinus rhythm, states that she had a dizziness episode when she woke up this morning.  States that it feels similar to her previous vertigo.  States that the room was spinning, lasted a couple minutes, sat down and she felt better.  States that it only occurs when she tilts her head and lays down in a certain position. Has vertigo about once a week, feels similar.  No headache, numbness, tingling, facial droop, speech difficulty, weakness.  She was not concerned about it, however was told to come here per her PCP.  Denies any chest pain, shortness of breath, leg swelling, nausea, vomiting, fevers, chills, back pain, URI-like symptoms.  States that she feels fine now, has not had a dizziness episode while being here.  Triage note states something about patient saying something about the Lord taking her, when I talked to the patient although she states that she does not have any suicidal or homicidal intent, states that she just made a comment about this.  States that she could not commit suicide  because then she would not be able to go to heaven. Has been compliant with medications.  HPI     Past Medical History:  Diagnosis Date   Atrial fibrillation (HCC)    Hypercholesteremia    Hypertension    Pneumonia    Thyroid disease    Vertigo     There are no problems to display for this patient.   History reviewed. No pertinent surgical history.   OB History   No obstetric history on file.     History reviewed. No pertinent family history.  Social History   Tobacco Use   Smoking status: Never Smoker   Smokeless tobacco: Never Used  Substance Use Topics   Alcohol use: No   Drug use: No    Home Medications Prior to Admission medications   Medication Sig Start Date End Date Taking? Authorizing Provider  apixaban (ELIQUIS) 5 MG TABS tablet Take 1 tablet (5 mg total) by mouth 2 (two) times daily. 11/06/17  Yes Mesner, Barbara Cower, MD  levothyroxine (SYNTHROID) 50 MCG tablet Take 50 mcg by mouth daily. 02/11/20  Yes [provider]  lisinopril-hydrochlorothiazide (PRINZIDE,ZESTORETIC) 10-12.5 MG per tablet Take 1 tablet by mouth daily.   Yes [provider]  metoprolol succinate (TOPROL-XL) 25 MG 24 hr tablet Take 1 tablet (25 mg total) by mouth daily. 11/06/17  Yes Mesner, Barbara Cower, MD  simvastatin (ZOCOR) 20 MG tablet Take 20 mg by mouth at bedtime. 07/02/20  Yes [provider]  sodium chloride (OCEAN) 0.65 % SOLN nasal spray Place 1 spray into both nostrils as needed for congestion.   Yes [provider]  cyanocobalamin 500 MCG tablet Take 500 mcg by mouth daily. Patient not taking: Reported on 07/16/2020    [provider]  lovastatin (MEVACOR) 20 MG tablet Take 40 mg by mouth at bedtime.  Patient not taking: Reported on 07/16/2020    [provider]  Omega-3 Fatty Acids (FISH OIL) 1200 MG CAPS Take 1 capsule by mouth daily.  Patient not taking: Reported on 07/16/2020    [provider]  omeprazole  (PRILOSEC) 20 MG capsule Take 20 mg by mouth daily. Patient not taking: Reported on 07/16/2020    [provider]  vitamin C (ASCORBIC ACID) 250 MG tablet Take 250 mg by mouth daily. Patient not taking: Reported on 07/16/2020    [provider]    Allergies    Pollen extract  Review of Systems   Review of Systems  Constitutional: Negative for chills, diaphoresis, fatigue and fever.  HENT: Negative for congestion, sore throat and trouble swallowing.   Eyes: Negative for pain and visual disturbance.  Respiratory: Negative for cough, chest tightness, shortness of breath and wheezing.   Cardiovascular: Negative for chest pain, palpitations and leg swelling.  Gastrointestinal: Negative for abdominal distention, abdominal pain, diarrhea, nausea and vomiting.  Genitourinary: Negative for difficulty urinating.  Musculoskeletal: Negative for back pain, neck pain and neck stiffness.  Skin: Negative for pallor.  Neurological: Positive for dizziness. Negative for facial asymmetry, speech difficulty, weakness, light-headedness, numbness and headaches.  Psychiatric/Behavioral: Negative for confusion.    Physical Exam Updated Vital Signs BP (!) 143/123    Pulse 60    Temp 98.2 F (36.8 C) (Oral)    Resp (!) 28    Ht 5\' 4"  (1.626 m)    Wt 77.1 kg    SpO2 95%    BMI 29.18 kg/m   Physical Exam Constitutional:      General: She is not in acute distress.    Appearance: Normal appearance. She is not ill-appearing, toxic-appearing or diaphoretic.     Comments: Patient appears well, sitting in bed no acute distress.  HENT:     Mouth/Throat:     Mouth: Mucous membranes are moist.     Pharynx: Oropharynx is clear.  Eyes:     General: No scleral icterus.    Extraocular Movements: Extraocular movements intact.     Right eye: Normal extraocular motion and no nystagmus.     Left eye: Normal extraocular motion and no nystagmus.     Pupils: Pupils are equal, round, and reactive to light.      Comments: No nystagmus.  Cardiovascular:     Rate and Rhythm: Normal rate and regular rhythm.     Pulses: Normal pulses.     Heart sounds: Normal heart sounds. No murmur heard.  No friction rub. No gallop.   Pulmonary:     Effort: Pulmonary effort is normal. No respiratory distress.     Breath sounds: Normal breath sounds. No stridor. No wheezing, rhonchi or rales.  Chest:     Chest wall: No tenderness.  Abdominal:     General: Abdomen is flat. There is no distension.     Palpations: Abdomen is soft.     Tenderness: There is no abdominal tenderness. There is no guarding or rebound.  Musculoskeletal:        General: No swelling or tenderness. Normal range of  motion.     Cervical back: Normal range of motion and neck supple. No rigidity.     Right lower leg: No edema.     Left lower leg: No edema.  Skin:    General: Skin is warm and dry.     Capillary Refill: Capillary refill takes less than 2 seconds.     Coloration: Skin is not pale.  Neurological:     General: No focal deficit present.     Mental Status: She is alert and oriented to person, place, and time.     Comments: Alert. Clear speech. No facial droop. CNIII-XII grossly intact. Bilateral upper and lower extremities' sensation grossly intact. 5/5 symmetric strength with grip strength and with plantar and dorsi flexion bilaterally. Normal finger to nose bilaterally. Negative pronator drift. Negative Romberg sign. Gait is steady and intact.   Patient did not want me to try and reproduce dizziness, states that if I were to lay her down and turn her head she would start having vertigo again.   Psychiatric:        Mood and Affect: Mood normal.        Behavior: Behavior normal.     ED Results / Procedures / Treatments   Labs (all labs ordered are listed, but only abnormal results are displayed) Labs Reviewed  BASIC METABOLIC PANEL - Abnormal; Notable for the following components:      Result Value   Glucose, Bld 103  (*)    Creatinine, Ser 1.10 (*)    GFR calc non Af Amer 48 (*)    All other components within normal limits  CBC  MAGNESIUM  TROPONIN I (HIGH SENSITIVITY)  TROPONIN I (HIGH SENSITIVITY)    EKG EKG Interpretation  Date/Time:  Wednesday July 16 2020 11:16:05 EDT Ventricular Rate:  116 PR Interval:    QRS Duration: 102 QT Interval:  401 QTC Calculation: 558 R Axis:   60 Text Interpretation: Atrial flutter/fibrillation Abnormal R-wave progression, early transition Borderline ST depression, diffuse leads Prolonged QT interval ST depression V1-V3, suggest recording posterior leads Since last tracing Afib has returned Confirmed by Susy Frizzle [phone]) on 07/16/2020 12:05:20 PM   Radiology DG Chest 2 View  Result Date: 07/16/2020 CLINICAL DATA:  79 year old female with atrial fibrillation. EXAM: CHEST - 2 VIEW COMPARISON:  Chest CT 11/06/2017 and earlier. FINDINGS: Chronic large lung volumes. Mediastinal contours are stable and within normal limits. Visualized tracheal air column is within normal limits. No pneumothorax or pulmonary edema. There is a small volume of pleural fluid now tracking into a major fissure on the lateral view. This is probably on the left. Chronic blunting of the lung bases is otherwise stable. No confluent pulmonary opacity. Osteopenia. No acute osseous abnormality identified. Abdominal Calcified aortic atherosclerosis. Negative visible bowel gas pattern. IMPRESSION: 1. Trace pleural fluid, but no pulmonary edema or other acute cardiopulmonary abnormality. 2. Chronic pulmonary hyperinflation. Electronically Signed   By: Odessa Fleming M.D.   On: 07/16/2020 12:08    Procedures Procedures (including critical care time)  Medications Ordered in ED Medications - No data to display  ED Course  I have reviewed the triage vital signs and the nursing notes.  Pertinent labs & imaging results that were available during my care of the patient were reviewed by me and  considered in my medical decision making (see chart for details).    MDM Rules/Calculators/A&P  Sonya [name] is a 79 y.o. female with past medical history of A. fib on metoprolol and Eliquis, hypercholesteremia, hypertension, thyroid disease, vertigo that presents the emergency department today via EMS for dizziness and A. fib.  PCP was concerned about dizziness due to A. fib.  When speaking to patient out this, states that her dizziness that she has had this morning felt exactly like her previous vertigo, has vertigo frequently almost every week.  States that it resolved after taking her meclizine which is typical. States that it has not come back in the 6 hours while she has been in the ER.  In regards to her A. fib she is on Eliquis and metoprolol, has been compliant with these.  Did appear to be in A. fib when she arrived however has not been in A. fib on repeat EKGs or on the monitor.  Is not currently complaining anything, normal neuro exam.  Patient did not want me to try and reproduce dizziness, does appear to be peripheral.  No central signs of vertigo.  No concern for acute process since vertigo has been occurring for multiple years.  However patient is concerned since primary care does not appear to answer any of her questions will give referral to neurology and cardiology this time.  Negative orthostatics.  Work-up today reassuring with normal CBC and BMP, first troponin 6, second troponin 6.  Normal magnesium.  Chest x-ray interpreted by me without any acute cardiopulmonary disease.  Patient to follow-up with PCP and to be discharged.  Patient is agreeable with this plan.  Doubt need for further emergent work up at this time. I explained the diagnosis and have given explicit precautions to return to the ER including for any other new or worsening symptoms. The patient understands and accepts the medical plan as it's been dictated and I have answered their questions.  Discharge instructions concerning home care and prescriptions have been given. The patient is STABLE and is discharged to home in good condition.  I discussed this case with my attending physicianwho cosigned this note including patient's presenting symptoms, physical exam, and planned diagnostics and interventions. Attending physician stated agreement with plan or made changes to plan which were implemented.   Attending physician, Dr. Deretha EmoryZackowski did see patient at bedside. Final Clinical Impression(s) / ED Diagnoses Final diagnoses:  Vertigo    Rx / DC Orders ED Discharge Orders    None       Farrel Gordonatel, Leeyah Heather, PA-C 07/16/20 1813    Vanetta MuldersZackowski, Scott, MD 07/18/20 1534

## 2020-07-16 NOTE — Discharge Instructions (Signed)
Your work-up today was reassuring, I want you to schedule an appointment with Dr. Wyline Mood, cardiology.  Also schedule an appointment with Dr. Gerilyn Pilgrim, neurology for your vertigo.  Their information is listed in this packet.  Your dizziness most likely due to your vertigo as we discussed.  If you have any new or worsening concerning symptoms please come back to the emergency department.

## 2020-07-16 NOTE — ED Triage Notes (Signed)
Pt to er room number 16 via ems, per ems [facility].  States that she felt dizzy, states that her ekg shows a fib, states that she has a hx of a fib but takes metop for this.  Pt reports dizziness, states that she took a pill to help with her dizziness, states that her dizziness is worse when she lays down or moves her head. Pt was in an afib like rhythm on the monitor, pt now appears to be in a sinus brady.  Pt states that she feels better now.

## 2020-12-04 ENCOUNTER — Ambulatory Visit: Payer: Self-pay | Admitting: Cardiology

## 2022-05-07 IMAGING — DX DG CHEST 2V
2 series · 2 of 2 positions shown · non-contrast
Comparison: Chest CT 11/06/2017 and earlier.

CLINICAL DATA: 79-year-old female with atrial fibrillation.

EXAM:
CHEST - 2 VIEW

[chest pa]
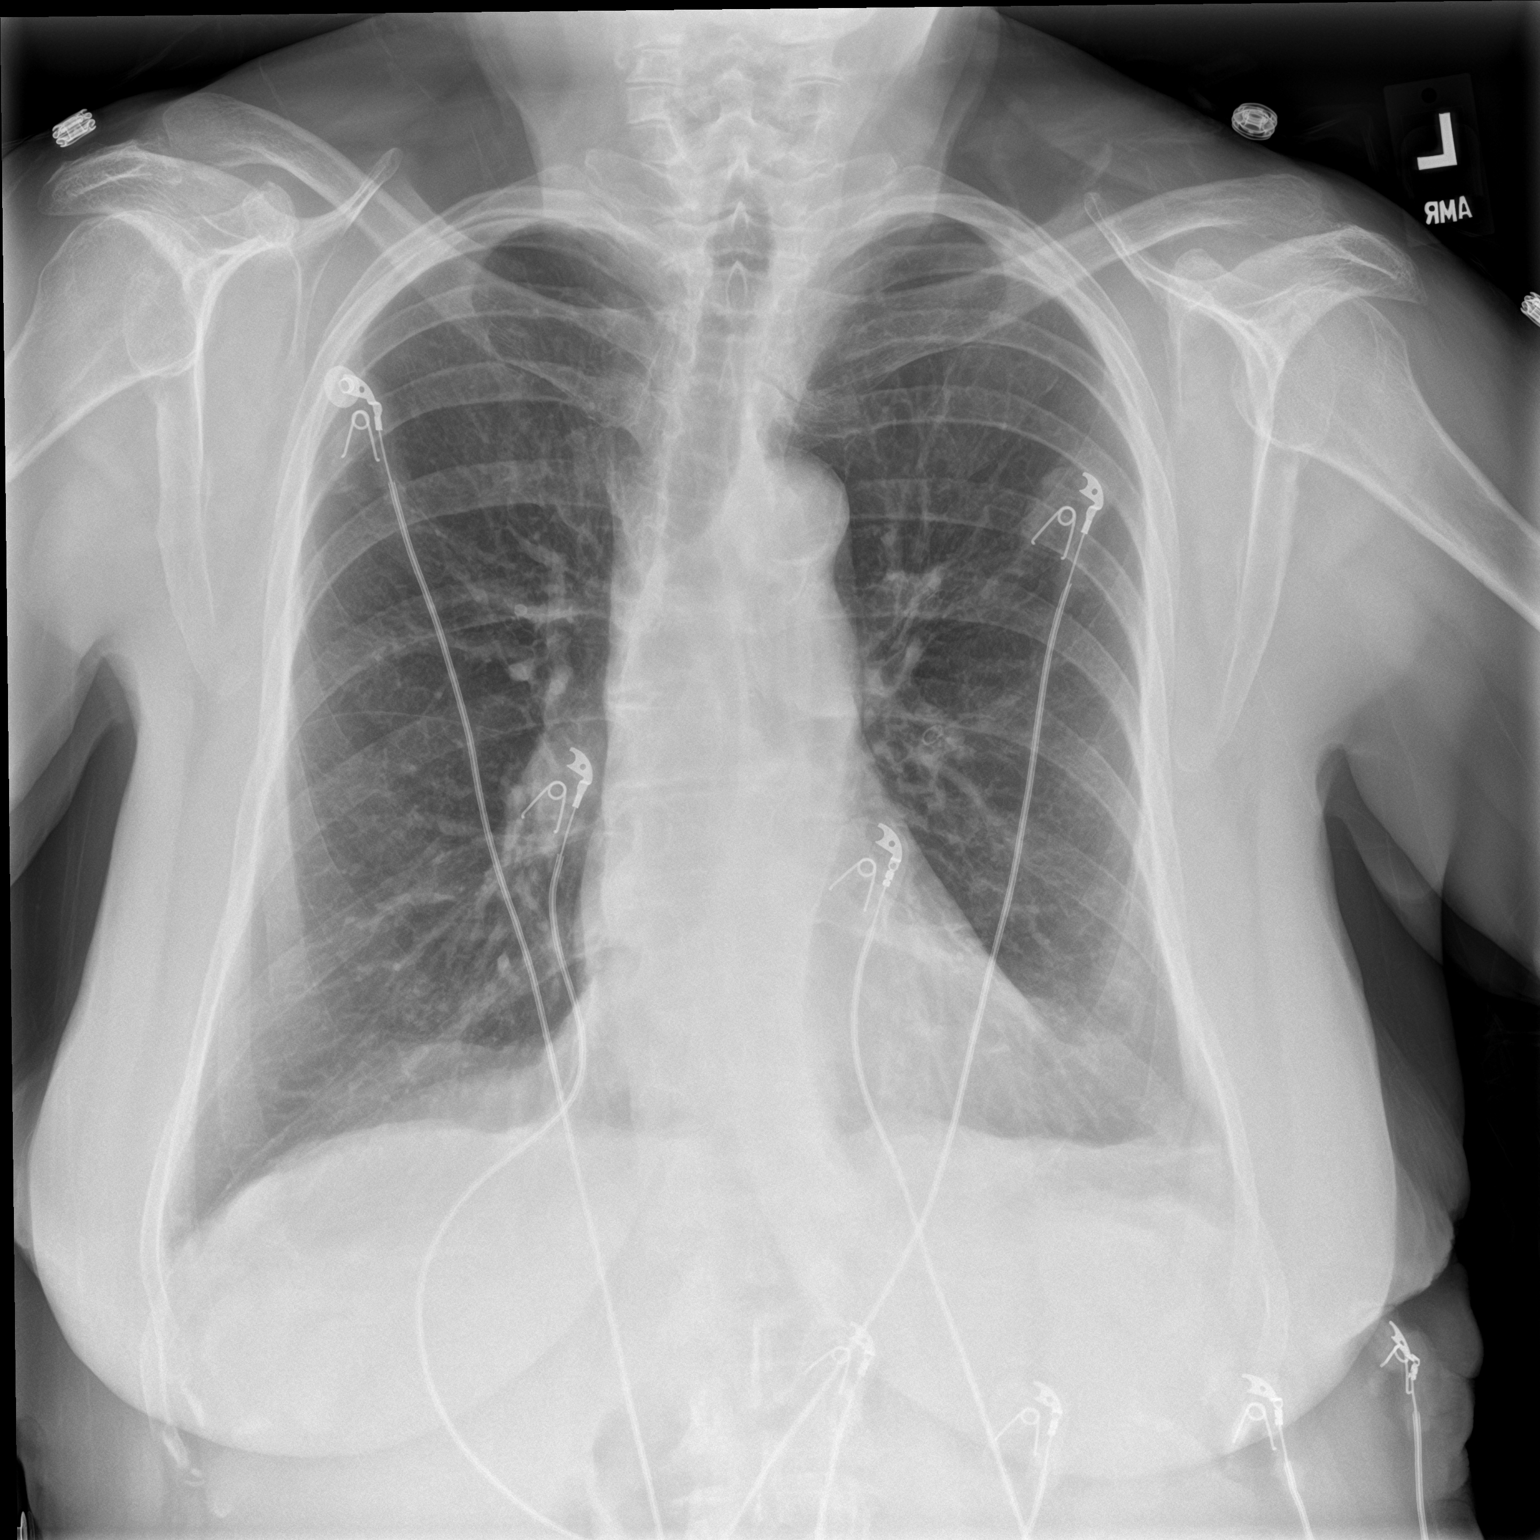

[chest lat]
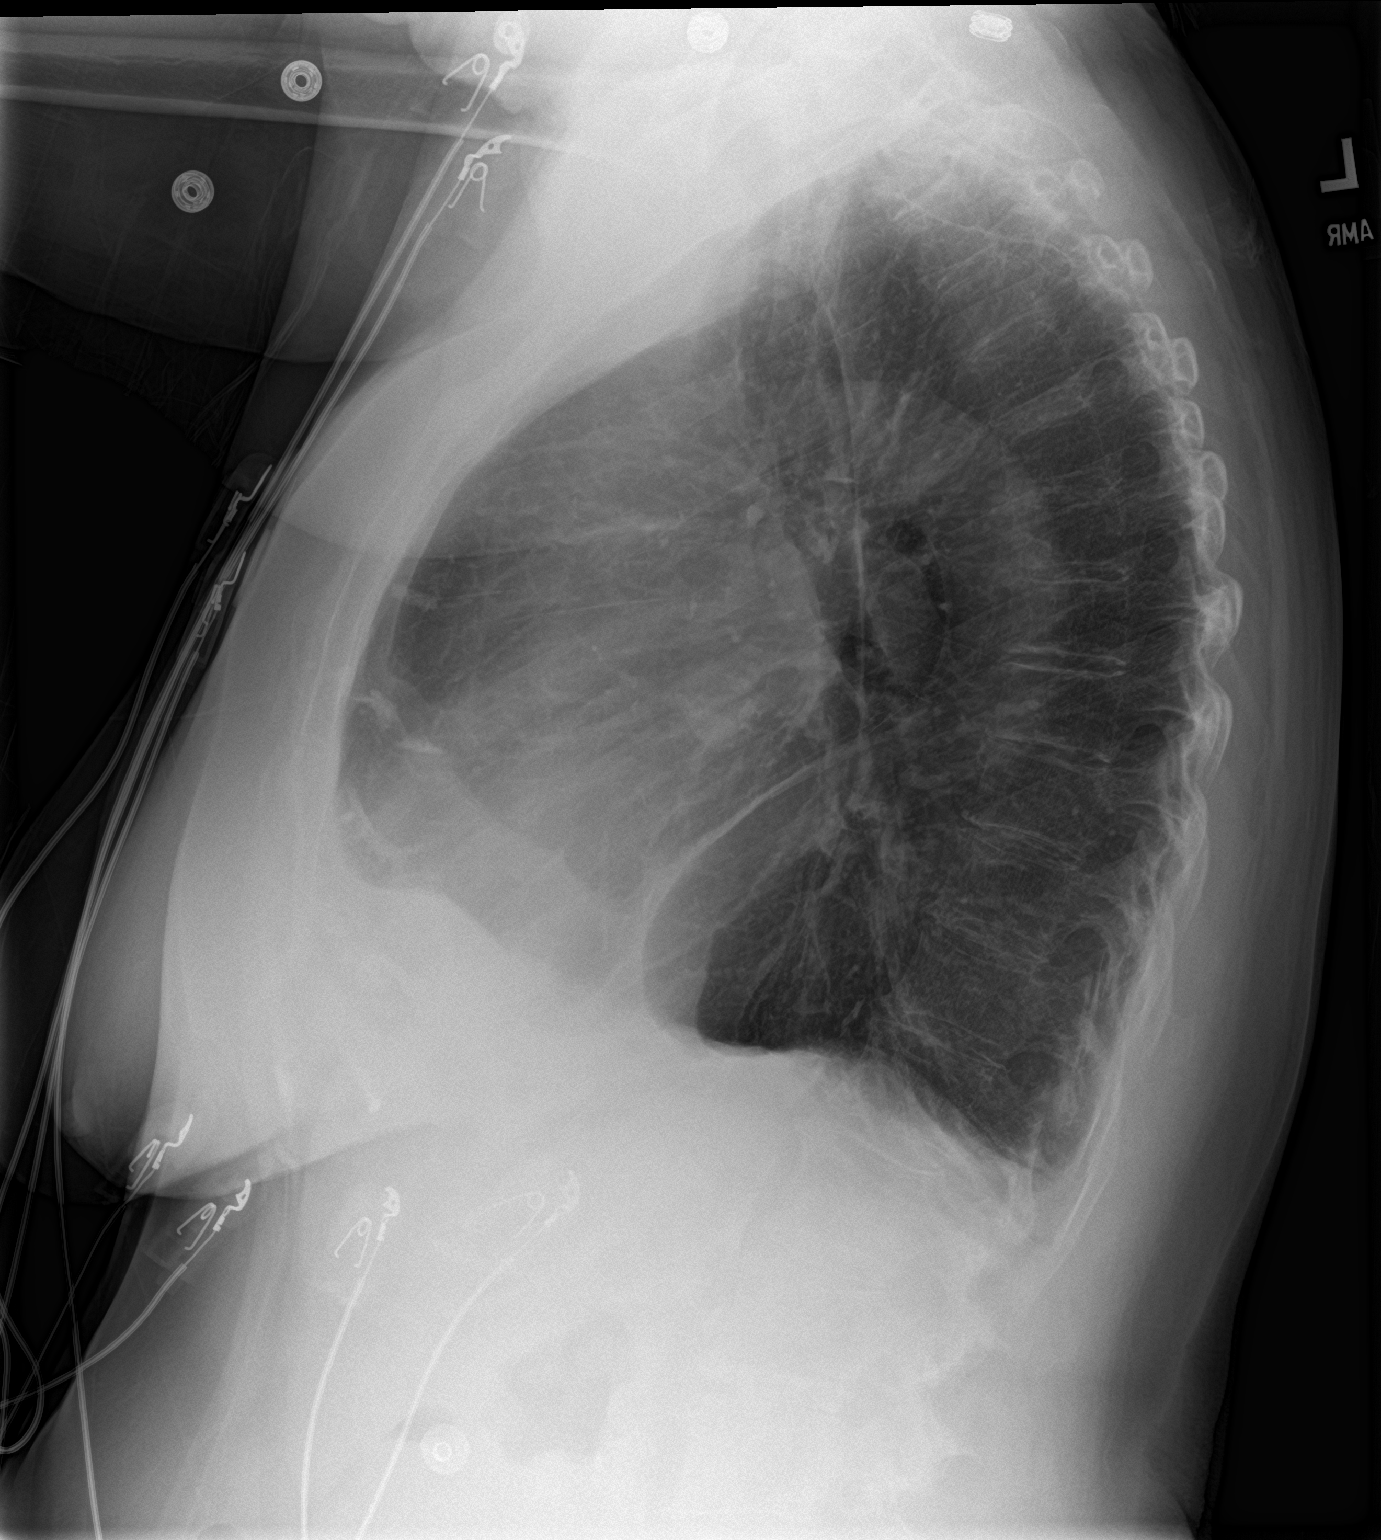

[2 of 2 positions shown; findings below may reference images not displayed]

FINDINGS: Chronic large lung volumes. Mediastinal contours are stable and
within normal limits. Visualized tracheal air column is within
normal limits. No pneumothorax or pulmonary edema. There is a small
volume of pleural fluid now tracking into a major fissure on the
lateral view. This is probably on the left. Chronic blunting of the
lung bases is otherwise stable. No confluent pulmonary opacity.

Osteopenia. No acute osseous abnormality identified. Abdominal
Calcified aortic atherosclerosis. Negative visible bowel gas
pattern.
IMPRESSION: 1. Trace pleural fluid, but no pulmonary edema or other acute
cardiopulmonary abnormality.
2. Chronic pulmonary hyperinflation.

## 2022-10-10 ENCOUNTER — Inpatient Hospital Stay (HOSPITAL_COMMUNITY)
Admission: EM | Admit: 2022-10-10 | Discharge: 2022-10-13 | DRG: 398 | Disposition: A | Discharge status: Home / Self Care | Payer: Medicare Other | Attending: Surgery | Admitting: Surgery

## 2022-10-10 ENCOUNTER — Emergency Department (HOSPITAL_COMMUNITY): Payer: Medicare Other

## 2022-10-10 ENCOUNTER — Encounter (HOSPITAL_COMMUNITY): Payer: Self-pay | Admitting: Emergency Medicine

## 2022-10-10 ENCOUNTER — Other Ambulatory Visit: Payer: Self-pay

## 2022-10-10 DIAGNOSIS — I4891 Unspecified atrial fibrillation: Secondary | ICD-10-CM | POA: Diagnosis present

## 2022-10-10 DIAGNOSIS — I10 Essential (primary) hypertension: Secondary | ICD-10-CM | POA: Diagnosis present

## 2022-10-10 DIAGNOSIS — K3532 Acute appendicitis with perforation and localized peritonitis, without abscess: Secondary | ICD-10-CM | POA: Diagnosis present

## 2022-10-10 DIAGNOSIS — K5641 Fecal impaction: Secondary | ICD-10-CM | POA: Diagnosis present

## 2022-10-10 DIAGNOSIS — N179 Acute kidney failure, unspecified: Secondary | ICD-10-CM | POA: Diagnosis present

## 2022-10-10 DIAGNOSIS — E7800 Pure hypercholesterolemia, unspecified: Secondary | ICD-10-CM | POA: Diagnosis present

## 2022-10-10 DIAGNOSIS — K35201 Acute appendicitis with generalized peritonitis, with perforation, without abscess: Secondary | ICD-10-CM

## 2022-10-10 DIAGNOSIS — K37 Unspecified appendicitis: Secondary | ICD-10-CM | POA: Diagnosis present

## 2022-10-10 DIAGNOSIS — R000 Tachycardia, unspecified: Secondary | ICD-10-CM | POA: Diagnosis present

## 2022-10-10 DIAGNOSIS — E039 Hypothyroidism, unspecified: Secondary | ICD-10-CM | POA: Diagnosis present

## 2022-10-10 DIAGNOSIS — R1084 Generalized abdominal pain: Principal | ICD-10-CM

## 2022-10-10 LAB — CBC
HCT: 40 % (ref 36.0–46.0)
Hemoglobin: 13.5 g/dL (ref 12.0–15.0)
MCH: 30.2 pg (ref 26.0–34.0)
MCHC: 33.8 g/dL (ref 30.0–36.0)
MCV: 89.5 fL (ref 80.0–100.0)
Platelets: 204 10*3/uL (ref 150–400)
RBC: 4.47 MIL/uL (ref 3.87–5.11)
RDW: 12.4 % (ref 11.5–15.5)
WBC: 10 10*3/uL (ref 4.0–10.5)
nRBC: 0 % (ref 0.0–0.2)

## 2022-10-10 LAB — COMPREHENSIVE METABOLIC PANEL
ALT: 8 U/L (ref 0–44)
AST: 12 U/L — ABNORMAL LOW (ref 15–41)
Albumin: 3.6 g/dL (ref 3.5–5.0)
Alkaline Phosphatase: 62 U/L (ref 38–126)
Anion gap: 9 (ref 5–15)
BUN: 16 mg/dL (ref 8–23)
CO2: 25 mmol/L (ref 22–32)
Calcium: 8.7 mg/dL — ABNORMAL LOW (ref 8.9–10.3)
Chloride: 100 mmol/L (ref 98–111)
Creatinine, Ser: 1.44 mg/dL — ABNORMAL HIGH (ref 0.44–1.00)
GFR, Estimated: 37 mL/min — ABNORMAL LOW (ref >60)
Glucose, Bld: 137 mg/dL — ABNORMAL HIGH (ref 70–99)
Potassium: 3.8 mmol/L (ref 3.5–5.1)
Sodium: 134 mmol/L — ABNORMAL LOW (ref 135–145)
Total Bilirubin: 1.3 mg/dL — ABNORMAL HIGH (ref 0.3–1.2)
Total Protein: 7.1 g/dL (ref 6.5–8.1)

## 2022-10-10 LAB — LIPASE, BLOOD: Lipase: 38 U/L (ref 11–51)

## 2022-10-10 LAB — LACTIC ACID, PLASMA: Lactic Acid, Venous: 1.2 mmol/L (ref 0.5–1.9)

## 2022-10-10 MED ORDER — HYDROMORPHONE HCL 1 MG/ML IJ SOLN
0.2500 mg | Freq: Once | INTRAMUSCULAR | Status: AC
  Administered 2022-10-10: 0.25 mg via INTRAVENOUS
  Filled 2022-10-10: qty 0.5

## 2022-10-10 MED ORDER — IOHEXOL 300 MG/ML  SOLN
80.0000 mL | Freq: Once | INTRAMUSCULAR | Status: AC | PRN
  Administered 2022-10-10: 80 mL via INTRAVENOUS

## 2022-10-10 MED ORDER — ONDANSETRON HCL 4 MG/2ML IJ SOLN
4.0000 mg | Freq: Three times a day (TID) | INTRAMUSCULAR | Status: DC | PRN
  Administered 2022-10-10 – 2022-10-11 (×3): 4 mg via INTRAVENOUS
  Filled 2022-10-10 (×3): qty 2

## 2022-10-10 MED ORDER — PIPERACILLIN-TAZOBACTAM 3.375 G IVPB 30 MIN
3.3750 g | Freq: Once | INTRAVENOUS | Status: AC
  Administered 2022-10-11: 3.375 g via INTRAVENOUS
  Filled 2022-10-10: qty 50

## 2022-10-10 NOTE — ED Triage Notes (Addendum)
Pt with abdominal pain x 1 day that is increasing in intensity. Pt also nauseated. Pt also reports constipation x 2 days. States she took some MOM and was able to have a "very hard BM".

## 2022-10-10 NOTE — ED Provider Notes (Signed)
[facility] EMERGENCY DEPARTMENT Provider Note   CSN: [id number] Arrival date & time: 10/10/22  2149     History  Chief Complaint  Patient presents with   Abdominal Pain    Sonya [name] is a 81 y.o. female with reported afib not on any medications who is BIBEMS with abdominal pain.   Patient presents w/ constant 10 out of 10 abd pain x 1 day, worsening. Located in her lower quadrants and up the sides of her abdomen. Denies any abdominal bloating.  Endorses nausea and 1 episode of emesis prior to EMS arrival.  Denies any melena/hematochezia.  States she tried to have a bowel movement earlier and it was like "1 small rock."  She denies any fevers/chills, chest pain, shortness of breath, urinary symptoms, vaginal bleeding.  She states she has a chronic history of a uterine prolapse that is not out right now because she is lying down but it bulges when she stands.  It does not normally cause her any abdominal pain.  She is never had this type of pain before.   Abdominal Pain      Home Medications Prior to Admission medications   Medication Sig Start Date End Date Taking? Authorizing Provider  apixaban (ELIQUIS) 5 MG TABS tablet Take 1 tablet (5 mg total) by mouth 2 (two) times daily. 11/06/17   Mesner, Corene Cornea, MD  cyanocobalamin 500 MCG tablet Take 500 mcg by mouth daily. Patient not taking: Reported on 07/16/2020    [provider]  levothyroxine (SYNTHROID) 50 MCG tablet Take 50 mcg by mouth daily. 02/11/20   [provider]  lisinopril-hydrochlorothiazide (PRINZIDE,ZESTORETIC) 10-12.5 MG per tablet Take 1 tablet by mouth daily.    [provider]  lovastatin (MEVACOR) 20 MG tablet Take 40 mg by mouth at bedtime.  Patient not taking: Reported on 07/16/2020    [provider]  metoprolol succinate (TOPROL-XL) 25 MG 24 hr tablet Take 1 tablet (25 mg total) by mouth daily. 11/06/17   Mesner, Corene Cornea, MD  Omega-3 Fatty Acids (FISH OIL) 1200 MG CAPS Take 1  capsule by mouth daily.  Patient not taking: Reported on 07/16/2020    [provider]  omeprazole (PRILOSEC) 20 MG capsule Take 20 mg by mouth daily. Patient not taking: Reported on 07/16/2020    [provider]  simvastatin (ZOCOR) 20 MG tablet Take 20 mg by mouth at bedtime. 07/02/20   [provider]  sodium chloride (OCEAN) 0.65 % SOLN nasal spray Place 1 spray into both nostrils as needed for congestion.    [provider]  vitamin C (ASCORBIC ACID) 250 MG tablet Take 250 mg by mouth daily. Patient not taking: Reported on 07/16/2020    [provider]      Allergies    Pollen extract    Review of Systems   Review of Systems  Gastrointestinal:  Positive for abdominal pain.   Review of systems Negative for f/c.  A 10 point review of systems was performed and is negative unless otherwise reported in HPI.  Physical Exam Updated Vital Signs BP (!) 163/75   Pulse 73   Temp 98 F (36.7 C) (Oral)   Resp 20   Ht 5\' 4"  (1.626 m)   Wt 81.2 kg   SpO2 97%   BMI 30.73 kg/m  Physical Exam General: Uncomfortable-appearing female, lying in bed.  HEENT: Sclera anicteric, MMM, trachea midline.  Cardiology: RRR, no murmurs/rubs/gallops. Resp: Normal respiratory rate and effort. CTAB, no wheezes, rhonchi,  crackles.  Abd: TTP diffusely, especially in lower quadrants, to mild palpation. Soft,  non-distended. No rebound tenderness or guarding.  MSK: No peripheral edema or signs of trauma. Skin: warm, dry. No rashes or lesions. Back: No CVA tenderness Neuro: A&Ox4, CNs II-XII grossly intact. MAEs. Sensation grossly intact.  Psych: Normal mood and affect.   ED Results / Procedures / Treatments   Labs (all labs ordered are listed, but only abnormal results are displayed) Labs Reviewed  COMPREHENSIVE METABOLIC PANEL - Abnormal; Notable for the following components:      Result Value   Sodium 134 (*)    Glucose, Bld 137 (*)    Creatinine, Ser 1.44  (*)    Calcium 8.7 (*)    AST 12 (*)    Total Bilirubin 1.3 (*)    GFR, Estimated 37 (*)    All other components within normal limits  LIPASE, BLOOD  CBC  URINALYSIS, ROUTINE W REFLEX MICROSCOPIC  LACTIC ACID, PLASMA  LACTIC ACID, PLASMA    EKG None  Radiology No results found.  Procedures Procedures    Medications Ordered in ED Medications  HYDROmorphone (DILAUDID) injection 0.25 mg (has no administration in time range)  ondansetron (ZOFRAN) injection 4 mg (has no administration in time range)    ED Course/ Medical Decision Making/ A&P                          Medical Decision Making Amount and/or Complexity of Data Reviewed Labs: ordered. Radiology: ordered.    This patient presents to the ED for concern of abdominal pain, this involves an extensive number of treatment options, and is a complaint that carries with it a high risk of complications and morbidity.  I considered the following differential and admission for this acute, potentially life threatening condition.   MDM:    For DDX for abdominal pain includes but is not limited to:  Abdominal exam without peritoneal signs. No evidence of acute abdomen at this time.  Patient is tender diffusely throughout her abdomen but especially in the lower quadrants.  Consider SBO especially with report of N/V and small hard stool; also consider UTI though no urinary symptoms, appendicitis, diverticulitis, mesenteric ischemia, biliary colic or cholecystitis though patient is afebrile, aortic aneurysm though patient is HDS. Patient states her uterus is not bulging right now, because she is lying down, and this is a chronic problem for her. Will evaluate with CT abdomen/pelvis and labs.    Clinical Course as of 10/10/22 2300  Sun Oct 10, 2022  2258 Lipase: 38 [HN]  2258 WBC: 10.0 [HN]  2258 Hemoglobin: 13.5 [HN]  2259 Creatinine(!): 1.44 AKI, last value was 1.10 [HN]  2259 Total Bilirubin(!): 1.3 Slightly increased  tbili, no prior for comparison [HN]  2259 Pain control with dilaudid [HN]    Clinical Course User Index [HN] Loetta Rough, MD    Labs: I Ordered, and personally interpreted labs.  The pertinent results include:  those listed above, UA and lactate pending  Imaging Studies ordered: I ordered imaging studies including CT abdomen pelvis, pending  Reevaluation: After the interventions noted above, I reevaluated the patient and found that they have :improved  Social Determinants of Health: Patient lives independently   Disposition:  Patient is signed out to the oncoming ED physician who is made aware of her history, presentation, exam, workup, and plan. Plan is to obtain remainder of labs and CT abd/pelvis.   Co morbidities that  complicate the patient evaluation  Past Medical History:  Diagnosis Date   Atrial fibrillation ([place])    Hypercholesteremia    Hypertension    Pneumonia    Thyroid disease    Vertigo      Medicines Meds ordered this encounter  Medications   HYDROmorphone (DILAUDID) injection 0.25 mg   ondansetron (ZOFRAN) injection 4 mg    I have reviewed the patients home medicines and have made adjustments as needed  Problem List / ED Course: Problem List Items Addressed This Visit   None Visit Diagnoses     Generalized abdominal pain    -  Primary   AKI (acute kidney injury) ([place])                       This note was created using dictation software, which may contain spelling or grammatical errors.    Audley Hose, MD 10/10/22 [phone]

## 2022-10-11 ENCOUNTER — Inpatient Hospital Stay (HOSPITAL_COMMUNITY): Payer: Medicare Other | Admitting: Anesthesiology

## 2022-10-11 ENCOUNTER — Encounter (HOSPITAL_COMMUNITY): Payer: Self-pay

## 2022-10-11 ENCOUNTER — Encounter (HOSPITAL_COMMUNITY): Admission: EM | Disposition: A | Discharge status: Home / Self Care | Payer: Self-pay | Source: Home / Self Care

## 2022-10-11 DIAGNOSIS — K35201 Acute appendicitis with generalized peritonitis, with perforation, without abscess: Secondary | ICD-10-CM | POA: Diagnosis present

## 2022-10-11 DIAGNOSIS — R000 Tachycardia, unspecified: Secondary | ICD-10-CM | POA: Diagnosis present

## 2022-10-11 DIAGNOSIS — E039 Hypothyroidism, unspecified: Secondary | ICD-10-CM

## 2022-10-11 DIAGNOSIS — I4891 Unspecified atrial fibrillation: Secondary | ICD-10-CM

## 2022-10-11 DIAGNOSIS — I10 Essential (primary) hypertension: Secondary | ICD-10-CM | POA: Diagnosis present

## 2022-10-11 DIAGNOSIS — N179 Acute kidney failure, unspecified: Secondary | ICD-10-CM | POA: Diagnosis present

## 2022-10-11 DIAGNOSIS — E7800 Pure hypercholesterolemia, unspecified: Secondary | ICD-10-CM | POA: Diagnosis present

## 2022-10-11 DIAGNOSIS — K3532 Acute appendicitis with perforation and localized peritonitis, without abscess: Secondary | ICD-10-CM | POA: Diagnosis present

## 2022-10-11 DIAGNOSIS — K37 Unspecified appendicitis: Secondary | ICD-10-CM | POA: Diagnosis present

## 2022-10-11 DIAGNOSIS — K5641 Fecal impaction: Secondary | ICD-10-CM | POA: Diagnosis present

## 2022-10-11 HISTORY — PX: LAPAROSCOPIC APPENDECTOMY: SHX408

## 2022-10-11 LAB — URINALYSIS, ROUTINE W REFLEX MICROSCOPIC
Bacteria, UA: NONE SEEN
Bilirubin Urine: NEGATIVE
Glucose, UA: NEGATIVE mg/dL
Hgb urine dipstick: NEGATIVE
Ketones, ur: NEGATIVE mg/dL
Leukocytes,Ua: NEGATIVE
Nitrite: NEGATIVE
Protein, ur: 30 mg/dL — AB
Specific Gravity, Urine: >1.046 — ABNORMAL HIGH (ref 1.005–1.030)
pH: 5 (ref 5.0–8.0)

## 2022-10-11 SURGERY — APPENDECTOMY, LAPAROSCOPIC
Anesthesia: General | Site: Abdomen

## 2022-10-11 MED ORDER — DEXTROSE-NACL 5-0.45 % IV SOLN: INTRAVENOUS | Status: DC

## 2022-10-11 MED ORDER — SODIUM CHLORIDE 0.9 % IR SOLN
Status: DC | PRN
  Administered 2022-10-11: 1000 mL

## 2022-10-11 MED ORDER — PIPERACILLIN-TAZOBACTAM 3.375 G IVPB
3.3750 g | Freq: Three times a day (TID) | INTRAVENOUS | Status: DC
  Administered 2022-10-12 – 2022-10-13 (×4): 3.375 g via INTRAVENOUS
  Filled 2022-10-11 (×4): qty 50

## 2022-10-11 MED ORDER — MORPHINE SULFATE (PF) 2 MG/ML IV SOLN: 2.0000 mg | INTRAVENOUS | Status: DC | PRN

## 2022-10-11 MED ORDER — ROCURONIUM BROMIDE 100 MG/10ML IV SOLN
INTRAVENOUS | Status: DC | PRN
  Administered 2022-10-11: 50 mg via INTRAVENOUS

## 2022-10-11 MED ORDER — PHENYLEPHRINE HCL-NACL 20-0.9 MG/250ML-% IV SOLN
INTRAVENOUS | Status: DC | PRN
  Administered 2022-10-11: 40 ug/min via INTRAVENOUS

## 2022-10-11 MED ORDER — PIPERACILLIN-TAZOBACTAM 3.375 G IVPB
3.3750 g | Freq: Three times a day (TID) | INTRAVENOUS | Status: DC
  Administered 2022-10-11 (×2): 3.375 g via INTRAVENOUS
  Filled 2022-10-11 (×2): qty 50

## 2022-10-11 MED ORDER — 0.9 % SODIUM CHLORIDE (POUR BTL) OPTIME
TOPICAL | Status: DC | PRN
  Administered 2022-10-11: 1000 mL

## 2022-10-11 MED ORDER — OXYCODONE HCL 5 MG PO TABS: 5.0000 mg | ORAL_TABLET | Freq: Once | ORAL | Status: DC | PRN

## 2022-10-11 MED ORDER — PROPOFOL 10 MG/ML IV BOLUS
INTRAVENOUS | Status: DC | PRN
  Administered 2022-10-11: 100 mg via INTRAVENOUS

## 2022-10-11 MED ORDER — FENTANYL CITRATE (PF) 100 MCG/2ML IJ SOLN
INTRAMUSCULAR | Status: DC | PRN
  Administered 2022-10-11: 75 ug via INTRAVENOUS
  Administered 2022-10-11: 50 ug via INTRAVENOUS

## 2022-10-11 MED ORDER — ACETAMINOPHEN 500 MG PO TABS: 1000.0000 mg | ORAL_TABLET | Freq: Once | ORAL | Status: DC | PRN

## 2022-10-11 MED ORDER — DIPHENHYDRAMINE HCL 12.5 MG/5ML PO ELIX: 12.5000 mg | ORAL_SOLUTION | Freq: Four times a day (QID) | ORAL | Status: DC | PRN

## 2022-10-11 MED ORDER — ACETAMINOPHEN 325 MG PO TABS: 650.0000 mg | ORAL_TABLET | Freq: Four times a day (QID) | ORAL | Status: DC | PRN

## 2022-10-11 MED ORDER — ONDANSETRON HCL 4 MG/2ML IJ SOLN: 4.0000 mg | Freq: Four times a day (QID) | INTRAMUSCULAR | Status: DC | PRN

## 2022-10-11 MED ORDER — ACETAMINOPHEN 10 MG/ML IV SOLN: 1000.0000 mg | Freq: Once | INTRAVENOUS | Status: DC | PRN

## 2022-10-11 MED ORDER — ACETAMINOPHEN 10 MG/ML IV SOLN
INTRAVENOUS | Status: AC
  Filled 2022-10-11: qty 100

## 2022-10-11 MED ORDER — ENOXAPARIN SODIUM 40 MG/0.4ML IJ SOSY: 40.0000 mg | PREFILLED_SYRINGE | INTRAMUSCULAR | Status: DC

## 2022-10-11 MED ORDER — BUPIVACAINE-EPINEPHRINE 0.5% -1:200000 IJ SOLN
INTRAMUSCULAR | Status: DC | PRN
  Administered 2022-10-11: 10 mL

## 2022-10-11 MED ORDER — SODIUM CHLORIDE 0.9 % IV SOLN: Freq: Once | INTRAVENOUS | Status: AC

## 2022-10-11 MED ORDER — PHENYLEPHRINE HCL (PRESSORS) 10 MG/ML IV SOLN
INTRAVENOUS | Status: DC | PRN
  Administered 2022-10-11: 160 ug via INTRAVENOUS
  Administered 2022-10-11: 240 ug via INTRAVENOUS

## 2022-10-11 MED ORDER — PROPOFOL 10 MG/ML IV BOLUS
INTRAVENOUS | Status: AC
  Filled 2022-10-11: qty 20

## 2022-10-11 MED ORDER — OXYCODONE HCL 5 MG PO TABS
5.0000 mg | ORAL_TABLET | ORAL | Status: DC | PRN
  Administered 2022-10-12: 5 mg via ORAL
  Filled 2022-10-11: qty 1

## 2022-10-11 MED ORDER — DIPHENHYDRAMINE HCL 50 MG/ML IJ SOLN: 12.5000 mg | Freq: Four times a day (QID) | INTRAMUSCULAR | Status: DC | PRN

## 2022-10-11 MED ORDER — OXYCODONE HCL 5 MG/5ML PO SOLN: 5.0000 mg | Freq: Once | ORAL | Status: DC | PRN

## 2022-10-11 MED ORDER — ALBUMIN HUMAN 5 % IV SOLN: INTRAVENOUS | Status: DC | PRN

## 2022-10-11 MED ORDER — ACETAMINOPHEN 650 MG RE SUPP: 650.0000 mg | Freq: Four times a day (QID) | RECTAL | Status: DC | PRN

## 2022-10-11 MED ORDER — FENTANYL CITRATE (PF) 100 MCG/2ML IJ SOLN: 25.0000 ug | INTRAMUSCULAR | Status: DC | PRN

## 2022-10-11 MED ORDER — ACETAMINOPHEN 160 MG/5ML PO SOLN: 1000.0000 mg | Freq: Once | ORAL | Status: DC | PRN

## 2022-10-11 MED ORDER — METOPROLOL TARTRATE 5 MG/5ML IV SOLN: 5.0000 mg | Freq: Four times a day (QID) | INTRAVENOUS | Status: DC | PRN

## 2022-10-11 MED ORDER — LACTATED RINGERS IV SOLN: INTRAVENOUS | Status: DC | PRN

## 2022-10-11 MED ORDER — SUGAMMADEX SODIUM 200 MG/2ML IV SOLN
INTRAVENOUS | Status: DC | PRN
  Administered 2022-10-11: 200 mg via INTRAVENOUS

## 2022-10-11 MED ORDER — SUCCINYLCHOLINE CHLORIDE 200 MG/10ML IV SOSY
PREFILLED_SYRINGE | INTRAVENOUS | Status: DC | PRN
  Administered 2022-10-11: 100 mg via INTRAVENOUS

## 2022-10-11 MED ORDER — DEXAMETHASONE SODIUM PHOSPHATE 10 MG/ML IJ SOLN
INTRAMUSCULAR | Status: DC | PRN
  Administered 2022-10-11: 10 mg via INTRAVENOUS

## 2022-10-11 MED ORDER — ONDANSETRON HCL 4 MG/2ML IJ SOLN
INTRAMUSCULAR | Status: DC | PRN
  Administered 2022-10-11: 4 mg via INTRAVENOUS

## 2022-10-11 MED ORDER — ACETAMINOPHEN 10 MG/ML IV SOLN
INTRAVENOUS | Status: DC | PRN
  Administered 2022-10-11: 1000 mg via INTRAVENOUS

## 2022-10-11 MED ORDER — ONDANSETRON 4 MG PO TBDP: 4.0000 mg | ORAL_TABLET | Freq: Four times a day (QID) | ORAL | Status: DC | PRN

## 2022-10-11 MED ORDER — FENTANYL CITRATE (PF) 250 MCG/5ML IJ SOLN
INTRAMUSCULAR | Status: AC
  Filled 2022-10-11: qty 5

## 2022-10-11 MED ORDER — MORPHINE SULFATE (PF) 4 MG/ML IV SOLN
4.0000 mg | INTRAVENOUS | Status: DC | PRN
  Administered 2022-10-11 (×4): 4 mg via INTRAVENOUS
  Filled 2022-10-11 (×4): qty 1

## 2022-10-11 MED ORDER — BUPIVACAINE-EPINEPHRINE (PF) 0.5% -1:200000 IJ SOLN
INTRAMUSCULAR | Status: AC
  Filled 2022-10-11: qty 30

## 2022-10-11 MED ORDER — LIDOCAINE HCL (CARDIAC) PF 100 MG/5ML IV SOSY
PREFILLED_SYRINGE | INTRAVENOUS | Status: DC | PRN
  Administered 2022-10-11: 50 mg via INTRAVENOUS

## 2022-10-11 SURGICAL SUPPLY — 40 items
ADH SKN CLS APL DERMABOND .7 (GAUZE/BANDAGES/DRESSINGS) ×1
APL PRP STRL LF DISP 70% ISPRP (MISCELLANEOUS) ×1
BAG COUNTER SPONGE SURGICOUNT (BAG) ×1 IMPLANT
BAG SPEC RTRVL 10 TROC 200 (ENDOMECHANICALS) ×1
BAG SPNG CNTER NS LX DISP (BAG) ×1
CANISTER SUCT 3000ML PPV (MISCELLANEOUS) ×1 IMPLANT
CHLORAPREP W/TINT 26 (MISCELLANEOUS) ×1 IMPLANT
CLIP LIGATING HEMO LOK XL GOLD (MISCELLANEOUS) ×2 IMPLANT
COVER SURGICAL LIGHT HANDLE (MISCELLANEOUS) ×2 IMPLANT
DERMABOND ADVANCED .7 DNX12 (GAUZE/BANDAGES/DRESSINGS) ×2 IMPLANT
ELECT REM PT RETURN 9FT ADLT (ELECTROSURGICAL) ×1
ELECTRODE REM PT RTRN 9FT ADLT (ELECTROSURGICAL) ×2 IMPLANT
ENDOLOOP SUT PDS II  0 18 (SUTURE)
ENDOLOOP SUT PDS II 0 18 (SUTURE) IMPLANT
GLOVE BIOGEL PI IND STRL 7.0 (GLOVE) ×2 IMPLANT
GLOVE SURG SS PI 7.0 STRL IVOR (GLOVE) ×2 IMPLANT
GOWN STRL REUS W/ TWL LRG LVL3 (GOWN DISPOSABLE) ×6 IMPLANT
GOWN STRL REUS W/TWL LRG LVL3 (GOWN DISPOSABLE) ×3
GRASPER SUT TROCAR 14GX15 (MISCELLANEOUS) ×2 IMPLANT
KIT BASIN OR (CUSTOM PROCEDURE TRAY) ×2 IMPLANT
KIT TURNOVER KIT B (KITS) ×2 IMPLANT
NDL 22X1.5 STRL (OR ONLY) (MISCELLANEOUS) ×1 IMPLANT
NEEDLE 22X1.5 STRL (OR ONLY) (MISCELLANEOUS) ×1 IMPLANT
NS IRRIG 1000ML POUR BTL (IV SOLUTION) ×2 IMPLANT
PAD ARMBOARD 7.5X6 YLW CONV (MISCELLANEOUS) ×4 IMPLANT
POUCH RETRIEVAL ECOSAC 10 (ENDOMECHANICALS) ×2 IMPLANT
POUCH RETRIEVAL ECOSAC 10MM (ENDOMECHANICALS) ×1
SCISSORS LAP 5X35 DISP (ENDOMECHANICALS) ×2 IMPLANT
SET IRRIG TUBING LAPAROSCOPIC (IRRIGATION / IRRIGATOR) ×2 IMPLANT
SET TUBE SMOKE EVAC HIGH FLOW (TUBING) ×2 IMPLANT
SLEEVE ENDOPATH XCEL 5M (ENDOMECHANICALS) ×2 IMPLANT
SPECIMEN JAR SMALL (MISCELLANEOUS) ×2 IMPLANT
SUT MNCRL AB 4-0 PS2 18 (SUTURE) ×2 IMPLANT
TOWEL GREEN STERILE (TOWEL DISPOSABLE) ×2 IMPLANT
TOWEL GREEN STERILE FF (TOWEL DISPOSABLE) ×1 IMPLANT
TRAY FOLEY W/BAG SLVR 14FR (SET/KITS/TRAYS/PACK) IMPLANT
TRAY LAPAROSCOPIC MC (CUSTOM PROCEDURE TRAY) ×2 IMPLANT
TROCAR XCEL 12X100 BLDLESS (ENDOMECHANICALS) ×2 IMPLANT
TROCAR Z-THREAD OPTICAL 5X100M (TROCAR) ×2 IMPLANT
WATER STERILE IRR 1000ML POUR (IV SOLUTION) ×1 IMPLANT

## 2022-10-11 NOTE — ED Notes (Signed)
Pt placed on O2 2L [place]. Pt O2 sat staying between 90-92% RA. Pt's O2 now at 96%

## 2022-10-11 NOTE — ED Notes (Signed)
Family at bedside. 

## 2022-10-11 NOTE — ED Notes (Addendum)
Pt resting comfortably  in bed at this time. NAD, VSS. Family given update.

## 2022-10-11 NOTE — ED Notes (Signed)
Pt's family called for an update and not happy about pt not being transferred to [facility] yet. Update given to family

## 2022-10-11 NOTE — Transfer of Care (Signed)
Immediate Anesthesia Transfer of Care Note  Patient: Sonya [name]  Procedure(s) Performed: LAPAROSCOPIC APPENDECTOMY (Abdomen)  Patient Location: PACU  Anesthesia Type:General  Level of Consciousness: awake, alert , and oriented  Airway & Oxygen Therapy: Patient Spontanous Breathing and Patient connected to nasal cannula oxygen  Post-op Assessment: Report given to RN, Post -op Vital signs reviewed and stable, and Patient moving all extremities  Post vital signs: Reviewed and stable  Last Vitals:  Vitals Value Taken Time  BP 109/63 10/11/22 2236  Temp 36.4 C 10/11/22 2236  Pulse 90 10/11/22 2239  Resp 16 10/11/22 2239  SpO2 92 % 10/11/22 2239  Vitals shown include unvalidated device data.  Last Pain:  Vitals:   10/11/22 2051  TempSrc:   PainSc: 0-No pain         Complications: No notable events documented.

## 2022-10-11 NOTE — ED Notes (Signed)
[facility] present to transport patient. 

## 2022-10-11 NOTE — Progress Notes (Signed)
Pt transported down for surgery. Report given.

## 2022-10-11 NOTE — Op Note (Signed)
Preoperative diagnosis: acute appendicitis with perforation  Postoperative diagnosis: Same   Procedure: laparoscopic appendectomy  Surgeon: Gurney Maxin, M.D.  Asst: none  Anesthesia: Gen.   Indications for procedure: Sonya [name] is a 82 y.o. female with symptoms of pain in right lower quadrant and nausea consistent with acute appendicitis. Confirmed by CT.  Description of procedure: The patient was brought into the operative suite, placed supine. Anesthesia was administered with endotracheal tube. The patient's left arm was tucked. All pressure points were offloaded by foam padding. The patient was prepped and draped in the usual sterile fashion.  A transverse incision was made to the left subcostal incision was made. A 27mm trocar was used to gain access to the peritoneal cavity by optical entry technique. Pneumoperitoneum was applied with a high flow and low pressure. The laparoscope was reinserted to confirm position.   1 65mm trocar was placed in the suprapubic space, one 12 mm trocar was placed in the LLQ. A transversus abdominal block was placed on the left and right sides. Next, the patient was placed in trendelenberg, rotated to the left.   Upon entering the abdomen (organ space), I encountered feculent peritonitis. The omentum was retracted cephalad. The cecum and appendix were identified. The appendix was perforated 2 cm from the base and was loosely adhered to the ileum which was carefully bluntly dissected free. The base of the appendix was dissected and a window through the mesoappendix was created with blunt dissection. Large Hem-o-lock clips were used to doubly ligate the base of the appendix and mesoappendix. The appendix was cut free with scissors.  The appendix was placed in a specimen bag. The pelvis and RLQ were irrigated. Multiple fecaliths were encountered and removed. Additional suction was applied to remove purulence and feculent matter. The appendix was removed  via the 12 mm trocar. 0 vicryl was used to close the fascial defect. Pneumoperitoneum was removed, all trocars were removed. All incisions were closed with 4-0 monocryl subcuticular stitch. The patient woke from anesthesia and was brought to PACU in stable condition.  Findings: feculent peritonitis  Specimen: appendix  Blood loss: 20 ml  Local anesthesia: 10 ml Marcaine  Complications: none  Gurney Maxin, M.D. General, Bariatric, & Minimally Invasive Surgery [facility]

## 2022-10-11 NOTE — H&P (Signed)
Reason for Consult:abdominal pain  Sonya [name] is an 82 y.o. female.  HPI: 82 yo female with 3 days of abdominal pain. It is severe on the right side.  Past Medical History:  Diagnosis Date   Atrial fibrillation (HCC)    Hypercholesteremia    Hypertension    Pneumonia    Thyroid disease    Vertigo     History reviewed. No pertinent surgical history.  History reviewed. No pertinent family history.  Social History:  reports that she has never smoked. She has never used smokeless tobacco. She reports that she does not drink alcohol and does not use drugs.  Allergies:  Allergies  Allergen Reactions   Pollen Extract Other (See Comments)    Seasonal allergies     Medications: I have reviewed the patient's current medications.  Results for orders placed or performed during the hospital encounter of 10/10/22 (from the past 48 hour(s))  Lipase, blood     Status: None   Collection Time: 10/10/22 10:20 PM  Result Value Ref Range   Lipase 38 11 - 51 U/L    Comment: Performed at [facility], [address], [place], [place] [address]  Comprehensive metabolic panel     Status: Abnormal   Collection Time: 10/10/22 10:20 PM  Result Value Ref Range   Sodium 134 (L) 135 - 145 mmol/L   Potassium 3.8 3.5 - 5.1 mmol/L   Chloride 100 98 - 111 mmol/L   CO2 25 22 - 32 mmol/L   Glucose, Bld 137 (H) 70 - 99 mg/dL    Comment: Glucose reference range applies only to samples taken after fasting for at least 8 hours.   BUN 16 8 - 23 mg/dL   Creatinine, Ser 8.31 (H) 0.44 - 1.00 mg/dL   Calcium 8.7 (L) 8.9 - 10.3 mg/dL   Total Protein 7.1 6.5 - 8.1 g/dL   Albumin 3.6 3.5 - 5.0 g/dL   AST 12 (L) 15 - 41 U/L   ALT 8 0 - 44 U/L   Alkaline Phosphatase 62 38 - 126 U/L   Total Bilirubin 1.3 (H) 0.3 - 1.2 mg/dL   GFR, Estimated 37 (L) >60 mL/min    Comment: (NOTE) Calculated using the CKD-EPI Creatinine Equation (2021)    Anion gap 9 5 - 15    Comment: Performed at [facility],  [address], [place], [place] [address]  CBC     Status: None   Collection Time: 10/10/22 10:20 PM  Result Value Ref Range   WBC 10.0 4.0 - 10.5 K/uL   RBC 4.47 3.87 - 5.11 MIL/uL   Hemoglobin 13.5 12.0 - 15.0 g/dL   HCT 60.7 37.1 - 06.2 %   MCV 89.5 80.0 - 100.0 fL   MCH 30.2 26.0 - 34.0 pg   MCHC 33.8 30.0 - 36.0 g/dL   RDW 69.4 85.4 - 62.7 %   Platelets 204 150 - 400 K/uL   nRBC 0.0 0.0 - 0.2 %    Comment: Performed at [facility], [address] [place], [place] [address]  Lactic acid, plasma     Status: None   Collection Time: 10/10/22 11:11 PM  Result Value Ref Range   Lactic Acid, Venous 1.2 0.5 - 1.9 mmol/L    Comment: Performed at [facility], [address], [place], [place] [address]  Urinalysis, Routine w reflex microscopic Urine, Clean Catch     Status: Abnormal   Collection Time: 10/11/22  9:24 AM  Result  Value Ref Range   Color, Urine YELLOW YELLOW   APPearance CLEAR CLEAR   Specific Gravity, Urine >1.046 (H) 1.005 - 1.030   pH 5.0 5.0 - 8.0   Glucose, UA NEGATIVE NEGATIVE mg/dL   Hgb urine dipstick NEGATIVE NEGATIVE   Bilirubin Urine NEGATIVE NEGATIVE   Ketones, ur NEGATIVE NEGATIVE mg/dL   Protein, ur 30 (A) NEGATIVE mg/dL   Nitrite NEGATIVE NEGATIVE   Leukocytes,Ua NEGATIVE NEGATIVE   RBC / HPF 0-5 0 - 5 RBC/hpf   WBC, UA 0-5 0 - 5 WBC/hpf   Bacteria, UA NONE SEEN NONE SEEN   Squamous Epithelial / LPF 6-10 0 - 5 /HPF    Comment: Performed at [facility], [address], [place], [place] [address]    CT ABDOMEN PELVIS W CONTRAST  Result Date: 10/10/2022 CLINICAL DATA:  Acute abdominal pain for 1 day, initial encounter EXAM: CT ABDOMEN AND PELVIS WITH CONTRAST TECHNIQUE: Multidetector CT imaging of the abdomen and pelvis was performed using the standard protocol following bolus administration of intravenous contrast. RADIATION DOSE REDUCTION: This exam was performed according to the departmental dose-optimization program which includes automated exposure  control, adjustment of the mA and/or kV according to patient size and/or use of iterative reconstruction technique. CONTRAST:  65mL OMNIPAQUE IOHEXOL 300 MG/ML  SOLN COMPARISON:  None Available. FINDINGS: Lower chest: Lung bases are free of acute infiltrate or sizable effusion. Mild scarring is seen. Hepatobiliary: Gallbladder demonstrates dependent gallstones. No wall thickening or pericholecystic fluid is noted. The liver is within normal limits. No biliary ductal dilatation is seen. Pancreas: Unremarkable. No pancreatic ductal dilatation or surrounding inflammatory changes. Spleen: Normal in size without focal abnormality. Adrenals/Urinary Tract: Adrenal glands are within normal limits. Kidneys demonstrate a normal enhancement pattern bilaterally. No renal calculi or obstructive changes are seen. Bladder is partially distended. Stomach/Bowel: Scattered diverticular change of the colon is noted without evidence of diverticulitis. There is considerable inflammatory change surrounding the cecum and ascending colon. A considerable amount of free air is noted adjacent to the cecum. An appendicolith is noted in the central portion of the appendix. The more peripheral portion is dilated to 13 mm with hyperemia identified and considerable inflammatory changes seen. The extraluminal air would be consistent with a localized perforation. No discrete abscess is noted at this time although some free fluid is noted adjacent to the appendix along with the free air. Small bowel and stomach are within normal limits. Vascular/Lymphatic: Aortic atherosclerosis. No enlarged abdominal or pelvic lymph nodes. Reproductive: Uterus and bilateral adnexa are unremarkable. Other: Small fat containing umbilical hernia is noted. No abdominopelvic ascites. Musculoskeletal: No acute or significant osseous findings. IMPRESSION: Changes consistent with acute appendicitis with evidence of localized perforation and free air and free fluid. No  definitive abscess is noted at this time. Cholelithiasis without complicating factors. No other focal abnormality is noted. Critical Value/emergent results were called by telephone at the time of interpretation on 10/10/2022 at 11:57 pm to Dr. Cindee Lame , who verbally acknowledged these results. Electronically Signed   By: Inez Catalina M.D.   On: 10/10/2022 23:57    ROS  PE Blood pressure 117/65, pulse 76, temperature 98.2 F (36.8 C), temperature source Oral, resp. rate 17, height 5\' 4"  (1.626 m), weight 81.2 kg, SpO2 93 %. Constitutional: NAD; conversant; no deformities Eyes: Moist conjunctiva; no lid lag; anicteric; PERRL Neck: Trachea midline; no thyromegaly Lungs: Normal respiratory effort; no tactile fremitus CV: RRR; no palpable thrills; no pitting edema GI: Abd guarding  on right side; no palpable hepatosplenomegaly MSK: Normal gait; no clubbing/cyanosis Psychiatric: Appropriate affect; alert and oriented x3 Lymphatic: No palpable cervical or axillary lymphadenopathy Skin: No major subcutaneous nodules. Warm and dry   Assessment/Plan: 82 yo female with acute appendicitis -IV abx -OR for lap appy -we discussed the details of the procedure; that it would be done under general anesthesia, that we would attempt to do the procedure laparoscopically. That the appendix would be isolated from the large and small intestine and then ligated and removed. We discussed the reason for this is to avoid rupture and infection and resolve the pains. We discussed risks of infection, abscess, injury to intestines or urinary structures, and need for open incision. She showed good understanding and wanted to proceed.   I reviewed last 24 h vitals and pain scores, last 48 h intake and output, last 24 h labs and trends, and last 24 h imaging results.  This care required high  level of medical decision making.   Arta Bruce Lucetta Baehr 10/11/2022, 9:53 PM

## 2022-10-11 NOTE — Anesthesia Preprocedure Evaluation (Addendum)
Anesthesia Evaluation  Patient identified by MRN, date of birth, ID band Patient awake    Reviewed: Allergy & Precautions, NPO status , Patient's Chart, lab work & pertinent test results  History of Anesthesia Complications Negative for: history of anesthetic complications  Airway Mallampati: I  TM Distance: >3 FB Neck ROM: Full    Dental  (+) Edentulous Upper, Edentulous Lower, Dental Advisory Given   Pulmonary neg pulmonary ROS   breath sounds clear to auscultation       Cardiovascular hypertension, Pt. on medications and Pt. on home beta blockers + dysrhythmias Atrial Fibrillation  Rhythm:Regular     Neuro/Psych negative neurological ROS  negative psych ROS   GI/Hepatic Neg liver ROS,,,appendicitis   Endo/Other  Hypothyroidism    Renal/GU Renal InsufficiencyRenal diseaseLab Results      Component                Value               Date                      CREATININE               1.44 (H)            10/10/2022                Musculoskeletal negative musculoskeletal ROS (+)    Abdominal   Peds  Hematology negative hematology ROS (+) Lab Results      Component                Value               Date                      WBC                      10.0                10/10/2022                HGB                      13.5                10/10/2022                HCT                      40.0                10/10/2022                MCV                      89.5                10/10/2022                PLT                      204                 10/10/2022            Eliquis    Anesthesia Other Findings   Reproductive/Obstetrics  Anesthesia Physical Anesthesia Plan  ASA: 3  Anesthesia Plan: General   Post-op Pain Management: Ofirmev IV (intra-op)*   Induction: Intravenous  PONV Risk Score and Plan: 3 and Ondansetron and Dexamethasone  Airway  Management Planned: Oral ETT  Additional Equipment: None  Intra-op Plan:   Post-operative Plan: Extubation in OR  Informed Consent: I have reviewed the patients History and Physical, chart, labs and discussed the procedure including the risks, benefits and alternatives for the proposed anesthesia with the patient or authorized representative who has indicated his/her understanding and acceptance.     Dental advisory given  Plan Discussed with: CRNA  Anesthesia Plan Comments:        Anesthesia Quick Evaluation

## 2022-10-11 NOTE — Anesthesia Procedure Notes (Signed)
Procedure Name: Intubation Date/Time: 10/11/2022 9:39 PM  Performed by: Jonanthan Bolender T, CRNAPre-anesthesia Checklist: Patient identified, Emergency Drugs available, Suction available and Patient being monitored Patient Re-evaluated:Patient Re-evaluated prior to induction Oxygen Delivery Method: Circle system utilized Preoxygenation: Pre-oxygenation with 100% oxygen Induction Type: IV induction, Rapid sequence and Cricoid Pressure applied Ventilation: Mask ventilation without difficulty Laryngoscope Size: Mac and 3 Grade View: Grade I Tube type: Oral Tube size: 7.0 mm Number of attempts: 1 Airway Equipment and Method: Stylet and Oral airway Placement Confirmation: ETT inserted through vocal cords under direct vision, positive ETCO2 and breath sounds checked- equal and bilateral Secured at: 21 cm Tube secured with: Tape Dental Injury: Teeth and Oropharynx as per pre-operative assessment

## 2022-10-11 NOTE — ED Notes (Signed)
Pt ambulated to the bathroom with standby assist 

## 2022-10-11 NOTE — ED Provider Notes (Signed)
Signed out pending CT scan.  CT scan with appendicitis with localized perforation.  Patient started on Zosyn.  See clinical course below.  Will plan for admission to [facility].  Patient n.p.o. and started on fluids. Physical Exam  BP (!) 149/79   Pulse 86   Temp 98 F (36.7 C) (Oral)   Resp 20   Ht 1.626 m (5\' 4" )   Wt 81.2 kg   SpO2 99%   BMI 30.73 kg/m   Procedures  Procedures  ED Course / MDM   Clinical Course as of 10/11/22 0032  Sun Oct 10, 2022  2258 Lipase: 38 [HN]  2258 WBC: 10.0 [HN]  2258 Hemoglobin: 13.5 [HN]  2259 Creatinine(!): 1.44 AKI, last value was 1.10 [HN]  2259 Total Bilirubin(!): 1.3 Slightly increased tbili, no prior for comparison [HN]  2259 Pain control with dilaudid [HN]  Mon Oct 11, 2022  0031 Spoke with local surgeon who recommends evaluation by general surgery at [facility] given localized perforation.  Patient was started on IV Zosyn and fluids.  Discussed with Michaelle Birks, general surgery at [facility].  She will admit the patient.  At this time, no indication for ER to ER transfer. [CH]    Clinical Course User Index [CH] Babak Lucus, Barbette Hair, MD [HN] Audley Hose, MD   Medical Decision Making Amount and/or Complexity of Data Reviewed Labs: ordered. Decision-making details documented in ED Course. Radiology: ordered.  Risk Prescription drug management. Decision regarding hospitalization.   Problem List Items Addressed This Visit   None Visit Diagnoses     Generalized abdominal pain    -  Primary   AKI (acute kidney injury) ([place])       Acute appendicitis with perforation and generalized peritonitis, without abscess or gangrene                 Merryl Hacker, MD 10/11/22 [phone]

## 2022-10-11 NOTE — Progress Notes (Signed)
Receive pt from [facility] via [place]. Pt vitals are stable but pt seems to be drowsy. EMT said that is "different than when they pick her up". Check her BS-105, she in bed resting able to answer my question appropriated. Call button and phone are in reach.  Page Dr. Kieth Brightly, will will be up to see patient in a bit.

## 2022-10-12 ENCOUNTER — Encounter (HOSPITAL_COMMUNITY): Payer: Self-pay | Admitting: General Surgery

## 2022-10-12 LAB — BASIC METABOLIC PANEL
Anion gap: 12 (ref 5–15)
BUN: 29 mg/dL — ABNORMAL HIGH (ref 8–23)
CO2: 26 mmol/L (ref 22–32)
Calcium: 8.8 mg/dL — ABNORMAL LOW (ref 8.9–10.3)
Chloride: 99 mmol/L (ref 98–111)
Creatinine, Ser: 1.86 mg/dL — ABNORMAL HIGH (ref 0.44–1.00)
GFR, Estimated: 27 mL/min — ABNORMAL LOW (ref >60)
Glucose, Bld: 139 mg/dL — ABNORMAL HIGH (ref 70–99)
Potassium: 4 mmol/L (ref 3.5–5.1)
Sodium: 137 mmol/L (ref 135–145)

## 2022-10-12 LAB — CBC
HCT: 37.2 % (ref 36.0–46.0)
Hemoglobin: 12.5 g/dL (ref 12.0–15.0)
MCH: 30.1 pg (ref 26.0–34.0)
MCHC: 33.6 g/dL (ref 30.0–36.0)
MCV: 89.6 fL (ref 80.0–100.0)
Platelets: 188 10*3/uL (ref 150–400)
RBC: 4.15 MIL/uL (ref 3.87–5.11)
RDW: 12.9 % (ref 11.5–15.5)
WBC: 6.5 10*3/uL (ref 4.0–10.5)
nRBC: 0 % (ref 0.0–0.2)

## 2022-10-12 LAB — GLUCOSE, CAPILLARY: Glucose-Capillary: 105 mg/dL — ABNORMAL HIGH (ref 70–99)

## 2022-10-12 LAB — PHOSPHORUS: Phosphorus: 4.5 mg/dL (ref 2.5–4.6)

## 2022-10-12 LAB — MAGNESIUM: Magnesium: 3 mg/dL — ABNORMAL HIGH (ref 1.7–2.4)

## 2022-10-12 MED ORDER — OXYCODONE HCL 5 MG PO TABS: 5.0000 mg | ORAL_TABLET | Freq: Four times a day (QID) | ORAL | Status: DC | PRN

## 2022-10-12 MED ORDER — ENOXAPARIN SODIUM 30 MG/0.3ML IJ SOSY
30.0000 mg | PREFILLED_SYRINGE | INTRAMUSCULAR | Status: DC
  Administered 2022-10-12: 30 mg via SUBCUTANEOUS
  Filled 2022-10-12: qty 0.3

## 2022-10-12 MED ORDER — ACETAMINOPHEN 650 MG RE SUPP: 650.0000 mg | Freq: Four times a day (QID) | RECTAL | Status: DC

## 2022-10-12 MED ORDER — ACETAMINOPHEN 325 MG PO TABS
650.0000 mg | ORAL_TABLET | Freq: Four times a day (QID) | ORAL | Status: DC
  Administered 2022-10-12 – 2022-10-13 (×5): 650 mg via ORAL
  Filled 2022-10-12 (×5): qty 2

## 2022-10-12 MED ORDER — HYDROMORPHONE HCL 1 MG/ML IJ SOLN
0.5000 mg | INTRAMUSCULAR | Status: DC | PRN
  Administered 2022-10-12: 0.5 mg via INTRAVENOUS
  Filled 2022-10-12: qty 0.5

## 2022-10-12 NOTE — Discharge Instructions (Addendum)
Per the website, [facility] SECTION 8 Waiting List is presently CLOSED. Continue to follow for the wait list to open back up, to put your name on the list. [address] [place], [place] [address]       Phone: [phone]     Fax: [phone]        Contact: [email or web address] .[facility] [address], [place], [place] [address]  [phone]  [facility] [place], [place], [place] [address]   [phone]  Visit [facility] and [employer] website for additional resources including low income energy assistance.   Agency Name:   [facility]   Address:  [address], [place], [place] [address]   Phone:   [phone]   Website:   www.adtsrc.org   Services Offered:     Meals on [employer] and Meals with Friends. Home care, at home assisted living, [place] for [place], transportation    Agency Name:   [facility]   Address:  Sites vary.  Must call first. Food Pantry location: [address] [place], [place] [address] [employer]   Phone:   [phone]   Website:   none   Services Offered:     Building services engineer, utility assistance if funds available Scientist, forensic for all of [place], [place], [place] for [facility] area only) Walk-in current Id and current address verification required.            Wed-Thurs: 9:30-12:00   Agency Name:   [facility]   Address:  [address], [place], [place] [address]    Phone:   [phone] or [phone]   Website:   none   Services Offered:     Food assistance   Agency Name:   [facility]   Address:  [address], [place], [place] [address]    Phone:   [phone] or [phone]    Website:   none   Services Offered:  Serves 1 hot meal a day at 11:00 am Monday-Sunday and 5 pm on the second  and fourth Sunday of each month   Agency Name:   [place] [facility] and [facility]  Services/Social Services    Address:  [place], [place], [place] [address]   Phone:   [phone]   Website:   [email or web address]        [email or web address]   Services Offered:     Physicist, medical, [facility] program    Agency Name:  [facility]   Address:  [address] [place], [place] [address]   Phone:  [phone]   Website:                      [email or web address]  Services Offered:    Food pantry Tuesday, Wednesday and Thursday 9am-11:30am  (need appointment) and health clinic (9:00am-11:00am)   Agency Name:  [employer] [facility]   Address:  [address], [place] / [address], [place]   Phone:  [phone] [place] / [phone] [place]   Website:  [email or web address]     [email or web address]      Services Offered:      Games developer, food pantry, soup kitchen Orthoptist) emergency financial assistance, thrift stores, showers & hygiene products ([place]),  Scooba, spiritual help    [employer]  2-1-1  Address: [address], [place], [place] [address] Phone: [phone] Website: www.nc211.org Services Offered: Connection to basic needs, childcare services, counseling,  health care, housing senior services, support groups, volunteer  opportunities.   Agency Name: [facility] Address: [address], [place], [place] [address] Phone: [phone] Website: www.[place].org Services Offered: Building surveyor, CPR Classes, disaster services (fire)  Rent/Utilities/Housing  Agency: [employer] Address: [address] [place], [place] [address]  [address] [place], [place] [address]  Phone Number: [phone] or [phone] For the hearing-impaired - Dial 711 for [place] Name: [place] and Human Services Address: [place],  [place], [place] [address] Phone: [phone] Website: [email or web address] Services Offered: Temporary financial assistance, subsidized housing, and utility  assistance   Agency Name: [employer]  Address: [address] [place], [place] [address] Phone: [phone] ext. [phone] Email: Contact: [email or web address] .org Website: GulfSpecialist.pl Services Offered: Subsidized apartment rent based on income.   Agency Name: [place] Address: [facility], [place]. [place], [place] Phone: [phone]  Website: www.ccmeden.org Services Offered: Building services engineer, utility assistance Corporate treasurer for all of  [facility], [place], [employer]  September 16, 2020 [address] and [place] for [place] area only), rent assistance.   Agency Name: [facility] Address: [place], [place], [place] [address] / [address],  [place] Phone: [phone] [place] / [phone] [place]: [email or web address] Services Offered: Games developer, food, showers, hygiene products utility payment  assistance, thrift shops, rental assistance, Support Group

## 2022-10-12 NOTE — Social Work (Signed)
CSW acknowledges SDOH screen flagged for food and utilities insecurity. CSW met with Pt and granddaughter at bedside. Pt states that they have been on the waiting list for a housing assistance voucher for over 2 years and are looking for advice on how to proceed with getting assistance. CSW advised pt on the barriers and wait times involved with these services and stated that resources will be added to AVS for alternative assistance programs. CSW researched the voucher pt is waiting on, per the housing authority website, the wait list is currently closed and they are no longer accepting applicants at this time. Info was also added to the AVS. CSW and family also discussed insurance and questions were answered. Family interested in utilizing the [employer] service here in the hospital to establish [facility] paperwork. They were advised to let the unit know when the preferred person is available. TOC is available for further needs.

## 2022-10-12 NOTE — Progress Notes (Signed)
Mobility Specialist - Progress Note   10/12/22 1450  Mobility  Activity Ambulated independently in hallway  Level of Assistance Modified independent, requires aide device or extra time  Assistive Device None  Distance Ambulated (ft) 300 ft  Activity Response Tolerated well  $Mobility charge 1 Mobility    Pt received sitting EOB agreeable to mobility. No complaints throughout, tolerated hallway distance well. Left sitting EOB w/ call bell in reach and all needs met.   [place] Specialist Please contact via SecureChat or Rehab office at [phone]

## 2022-10-12 NOTE — Progress Notes (Signed)
/[facility] Progress Note  1 Day Post-Op  Subjective: CC:  Reports feeling better  compared to pre-op. Denies nausea or emesis. Tolerating water. Denies flatus or BM. Has not been OOB yet. Family member sleeping at bedside.  Objective: Vital signs in last 24 hours: Temp:  [97.1 F (36.2 C)-98.4 F (36.9 C)] 98.1 F (36.7 C) (01/02 0524) Pulse Rate:  [72-97] 78 (01/02 0524) Resp:  [10-18] 17 (01/02 0524) BP: (92-132)/(56-80) 115/80 (01/02 0524) SpO2:  [90 %-99 %] 94 % (01/02 0524) Last BM Date : 10/10/22  Intake/Output from previous day: 01/01 0701 - 01/02 0700 In: 1356.6 [I.V.:1051.9; IV Piggyback:304.7] Out: 5 [Blood:5] Intake/Output this shift: No intake/output data recorded.  PE: Gen:  Alert, NAD, pleasant Card:  Regular rate and rhythm, pedal pulses 2+ BL Pulm:  Normal effort, clear to auscultation bilaterally Abd: Soft, mild distention, incisions c/d/I with appropriate tenderness, no rebound/guarding.  Skin: warm and dry, no rashes  Psych: A&Ox4 - person, [facility], 2024, abd surgery  Lab Results:  Recent Labs    10/10/22 2220 10/12/22 0322  WBC 10.0 6.5  HGB 13.5 12.5  HCT 40.0 37.2  PLT 204 188   BMET Recent Labs    10/10/22 2220 10/12/22 0322  NA 134* 137  K 3.8 4.0  CL 100 99  CO2 25 26  GLUCOSE 137* 139*  BUN 16 29*  CREATININE 1.44* 1.86*  CALCIUM 8.7* 8.8*   PT/INR No results for input(s): "LABPROT", "INR" in the last 72 hours. CMP     Component Value Date/Time   NA 137 10/12/2022 0322   K 4.0 10/12/2022 0322   CL 99 10/12/2022 0322   CO2 26 10/12/2022 0322   GLUCOSE 139 (H) 10/12/2022 0322   BUN 29 (H) 10/12/2022 0322   CREATININE 1.86 (H) 10/12/2022 0322   CALCIUM 8.8 (L) 10/12/2022 0322   PROT 7.1 10/10/2022 2220   ALBUMIN 3.6 10/10/2022 2220   AST 12 (L) 10/10/2022 2220   ALT 8 10/10/2022 2220   ALKPHOS 62 10/10/2022 2220   BILITOT 1.3 (H) 10/10/2022 2220   GFRNONAA 27 (L) 10/12/2022 0322   GFRAA 52 (L) 11/06/2017  1707   Lipase     Component Value Date/Time   LIPASE 38 10/10/2022 2220       Studies/Results: CT ABDOMEN PELVIS W CONTRAST  Result Date: 10/10/2022 CLINICAL DATA:  Acute abdominal pain for 1 day, initial encounter EXAM: CT ABDOMEN AND PELVIS WITH CONTRAST TECHNIQUE: Multidetector CT imaging of the abdomen and pelvis was performed using the standard protocol following bolus administration of intravenous contrast. RADIATION DOSE REDUCTION: This exam was performed according to the departmental dose-optimization program which includes automated exposure control, adjustment of the mA and/or kV according to patient size and/or use of iterative reconstruction technique. CONTRAST:  58mL OMNIPAQUE IOHEXOL 300 MG/ML  SOLN COMPARISON:  None Available. FINDINGS: Lower chest: Lung bases are free of acute infiltrate or sizable effusion. Mild scarring is seen. Hepatobiliary: Gallbladder demonstrates dependent gallstones. No wall thickening or pericholecystic fluid is noted. The liver is within normal limits. No biliary ductal dilatation is seen. Pancreas: Unremarkable. No pancreatic ductal dilatation or surrounding inflammatory changes. Spleen: Normal in size without focal abnormality. Adrenals/Urinary Tract: Adrenal glands are within normal limits. Kidneys demonstrate a normal enhancement pattern bilaterally. No renal calculi or obstructive changes are seen. Bladder is partially distended. Stomach/Bowel: Scattered diverticular change of the colon is noted without evidence of diverticulitis. There is considerable inflammatory change surrounding the cecum and ascending colon.  A considerable amount of free air is noted adjacent to the cecum. An appendicolith is noted in the central portion of the appendix. The more peripheral portion is dilated to 13 mm with hyperemia identified and considerable inflammatory changes seen. The extraluminal air would be consistent with a localized perforation. No discrete abscess is  noted at this time although some free fluid is noted adjacent to the appendix along with the free air. Small bowel and stomach are within normal limits. Vascular/Lymphatic: Aortic atherosclerosis. No enlarged abdominal or pelvic lymph nodes. Reproductive: Uterus and bilateral adnexa are unremarkable. Other: Small fat containing umbilical hernia is noted. No abdominopelvic ascites. Musculoskeletal: No acute or significant osseous findings. IMPRESSION: Changes consistent with acute appendicitis with evidence of localized perforation and free air and free fluid. No definitive abscess is noted at this time. Cholelithiasis without complicating factors. No other focal abnormality is noted. Critical Value/emergent results were called by telephone at the time of interpretation on 10/10/2022 at 11:57 pm to Dr. Cindee Lame , who verbally acknowledged these results. Electronically Signed   By: Inez Catalina M.D.   On: 10/10/2022 23:57    Anti-infectives: Anti-infectives (From admission, onward)    Start     Dose/Rate Route Frequency Ordered Stop   10/11/22 2200  piperacillin-tazobactam (ZOSYN) IVPB 3.375 g        3.375 g 12.5 mL/hr over 240 Minutes Intravenous Every 8 hours 10/11/22 1951 10/18/22 2159   10/11/22 0600  piperacillin-tazobactam (ZOSYN) IVPB 3.375 g  Status:  Discontinued        3.375 g 12.5 mL/hr over 240 Minutes Intravenous Every 8 hours 10/11/22 0033 10/11/22 1955   10/11/22 0000  piperacillin-tazobactam (ZOSYN) IVPB 3.375 g        3.375 g 100 mL/hr over 30 Minutes Intravenous  Once 10/10/22 2358 10/11/22 0131        Assessment/Plan  Acute appendicitis with perforation and feculent peritonitis  S/p laparoscopic appendectomy 10/11/22 Dr. Kieth Brightly - afebrile, VSS - tachycardia resolved   - continue clear liquids at await bowel function, anticipate she will have an ileus - cotinue IV abx, plan for a total of 5 days abx for now - mobilize, IS   FEN: CLD, IVF 50 cc/hr Foley: none, spont  voids ID: Zosyn  VTE: lovenox Dispo: med-surg    LOS: 1 day   I reviewed nursing notes, last 24 h vitals and pain scores, last 48 h intake and output, last 24 h labs and trends, and last 24 h imaging results.    Obie Dredge, PA-C [facility] Please see Amion for pager number during day hours 7:00am-4:30pm

## 2022-10-13 LAB — MAGNESIUM: Magnesium: 2.7 mg/dL — ABNORMAL HIGH (ref 1.7–2.4)

## 2022-10-13 LAB — BASIC METABOLIC PANEL
Anion gap: 11 (ref 5–15)
BUN: 33 mg/dL — ABNORMAL HIGH (ref 8–23)
CO2: 23 mmol/L (ref 22–32)
Calcium: 8.2 mg/dL — ABNORMAL LOW (ref 8.9–10.3)
Chloride: 96 mmol/L — ABNORMAL LOW (ref 98–111)
Creatinine, Ser: 1.55 mg/dL — ABNORMAL HIGH (ref 0.44–1.00)
GFR, Estimated: 34 mL/min — ABNORMAL LOW (ref >60)
Glucose, Bld: 121 mg/dL — ABNORMAL HIGH (ref 70–99)
Potassium: 3.5 mmol/L (ref 3.5–5.1)
Sodium: 130 mmol/L — ABNORMAL LOW (ref 135–145)

## 2022-10-13 MED ORDER — AMOXICILLIN-POT CLAVULANATE 500-125 MG PO TABS: 1.0000 | ORAL_TABLET | Freq: Three times a day (TID) | ORAL | 0 refills | Status: AC

## 2022-10-13 MED ORDER — OXYCODONE HCL 5 MG PO TABS: 5.0000 mg | ORAL_TABLET | Freq: Three times a day (TID) | ORAL | 0 refills | Status: AC | PRN

## 2022-10-13 MED ORDER — ONDANSETRON 4 MG PO TBDP: 4.0000 mg | ORAL_TABLET | Freq: Three times a day (TID) | ORAL | 0 refills | Status: AC | PRN

## 2022-10-13 MED ORDER — DOCUSATE SODIUM 100 MG PO CAPS: 100.0000 mg | ORAL_CAPSULE | Freq: Two times a day (BID) | ORAL | 0 refills | Status: AC

## 2022-10-13 NOTE — Progress Notes (Signed)
Discharge instructions reviewed with pt and her granddaughter. Copy of instructions given to pt. Pt informed scripts sent to her pharmacy for pick up. Pt just had her breakfast arrive as discharge nurse went in to review instructions, pt to eat her "solid food", and get dressed, another granddaughter is on her way for picking up pt.  Granddaughter is to call when arrives at the main entrance.  Pt and granddaughter encouraged to make sure she follows up with surgery as well as her primary MD-- states she is getting a new primary doctor.   Pt to be  d/c'd via wheelchair with belongings, with granddaughters.             To be escorted by staff/volunteer.

## 2022-10-13 NOTE — Anesthesia Postprocedure Evaluation (Signed)
Anesthesia Post Note  Patient: Sonya [name]  Procedure(s) Performed: LAPAROSCOPIC APPENDECTOMY (Abdomen)     Patient location during evaluation: PACU Anesthesia Type: General Level of consciousness: awake and patient cooperative Pain management: pain level controlled Vital Signs Assessment: post-procedure vital signs reviewed and stable Respiratory status: spontaneous breathing, nonlabored ventilation, respiratory function stable and patient connected to nasal cannula oxygen Cardiovascular status: blood pressure returned to baseline and stable Postop Assessment: no apparent nausea or vomiting Anesthetic complications: no  No notable events documented.  Last Vitals:  Vitals:   10/12/22 2336 10/13/22 0429  BP: 137/67 106/65  Pulse: 94 94  Resp:  16  Temp: 36.5 C 36.7 C  SpO2: 99% 97%    Last Pain:  Vitals:   10/13/22 0429  TempSrc: Oral  PainSc:    Pain Goal:                   Rashida Ladouceur

## 2022-10-13 NOTE — Plan of Care (Signed)

## 2022-10-13 NOTE — Progress Notes (Signed)
Pt given paper scrub pants to wear home.

## 2022-10-13 NOTE — Progress Notes (Signed)
Mobility Specialist - Progress Note   10/13/22 0900  Mobility  Activity Ambulated independently in hallway  Level of Assistance Modified independent, requires aide device or extra time  Assistive Device Other (Comment) (IV Pole)  Distance Ambulated (ft) 300 ft  Activity Response Tolerated well  Mobility Referral Yes  $Mobility charge 1 Mobility    Pt received in bed agreeable to mobility. No complaints throughout, tolerated distance well. Left in bed w/ call bell in reach and all needs met.   [facility] Specialist Please contact via SecureChat or Rehab office at [phone]

## 2022-10-13 NOTE — Discharge Summary (Signed)
Physician Discharge Summary  Patient ID: Sonya [name] MRN: [id number] DOB/AGE: [birth year] 82 y.o.  Admit date: 10/10/2022 Discharge date: 10/13/2022  Admission Diagnoses: appendicitis  Discharge Diagnoses:  Principal Problem:   Appendicitis Active Problems:   Ruptured appendicitis   Discharged Condition: good  Hospital Course: Patient underwent laparoscopic appendectomy 10/11/22 with intra-op findings of perforated appendicitis with feculent peritonitis. She was observed and monitored and over the next two days was able to tolerate a diet without nausea or bloating, was having bowel movements, was ambulating independently, and her pain was well controlled. She was deemed stable for discharged on 10/13/22   Discharge Exam: Blood pressure 106/65, pulse 94, temperature 98 F (36.7 C), temperature source Oral, resp. rate 16, height 5\' 4"  (1.626 m), weight 81.2 kg, SpO2 97 %. A&Ox3 Unlabored respirations Abd soft, nontender, nondistended. Incisions c/d/I with dermabond, no cellulitis or hematoma  Disposition: Discharge disposition: 01-Home or Self Care      Allergies as of 10/13/2022       Reactions   Pollen Extract Other (See Comments)   Seasonal allergies         Medication List     TAKE these medications    amoxicillin-clavulanate 500-125 MG tablet Commonly known as: Augmentin Take 1 tablet by mouth 3 (three) times daily for 7 days.   apixaban 5 MG Tabs tablet Commonly known as: Eliquis Take 1 tablet (5 mg total) by mouth 2 (two) times daily.   docusate sodium 100 MG capsule Commonly known as: Colace Take 1 capsule (100 mg total) by mouth 2 (two) times daily. Okay to decrease to once daily or stop taking if having loose bowel movements   metoprolol succinate 25 MG 24 hr tablet Commonly known as: TOPROL-XL Take 1 tablet (25 mg total) by mouth daily.   ondansetron 4 MG disintegrating tablet Commonly known as: ZOFRAN-ODT Take 1 tablet (4 mg total) by mouth  every 8 (eight) hours as needed for nausea or vomiting.   oxyCODONE 5 MG immediate release tablet Commonly known as: Roxicodone Take 1 tablet (5 mg total) by mouth every 8 (eight) hours as needed. Alternate tylenol and ibuprofen for the first few days. Take narcotic pain medication only if needed for severe/ breakthrough pain.   simvastatin 20 MG tablet Commonly known as: ZOCOR Take 20 mg by mouth at bedtime.        Follow-up Information     [facility], [place]. Schedule an appointment as soon as possible for a visit in 2 week(s).   Specialty: General Surgery Contact information: [address] [place] [address] [phone]                 Signed: Clovis Riley 10/13/2022, 8:23 AM

## 2022-10-14 LAB — SURGICAL PATHOLOGY
